# Patient Record
Sex: Male | Born: 1940 | Race: White | Hispanic: No
Health system: Southern US, Community
[De-identification: ages and names within clinical notes are randomized; demographics above are authoritative.]

---

## 2005-09-05 ENCOUNTER — Ambulatory Visit: Payer: Self-pay | Admitting: Family Medicine

## 2007-06-30 ENCOUNTER — Ambulatory Visit: Payer: Self-pay | Admitting: Gastroenterology

## 2007-07-31 ENCOUNTER — Inpatient Hospital Stay: Payer: Self-pay | Admitting: Orthopaedic Surgery

## 2007-08-01 ENCOUNTER — Other Ambulatory Visit: Payer: Self-pay

## 2011-03-07 ENCOUNTER — Ambulatory Visit: Payer: Self-pay | Admitting: Neurology

## 2012-01-15 ENCOUNTER — Ambulatory Visit: Payer: Self-pay

## 2012-03-16 ENCOUNTER — Emergency Department: Payer: Self-pay | Admitting: Emergency Medicine

## 2012-03-16 LAB — CSF CELL COUNT WITH DIFFERENTIAL
CSF Tube #: 3
Eosinophil: 0 %
Lymphocytes: 17 %
Lymphocytes: 55 %
Monocytes/Macrophages: 0 %
Monocytes/Macrophages: 18 %
Neutrophils: 27 %
Neutrophils: 83 %
Other Cells: 0 %
Other Cells: 0 %
RBC (CSF): 7422 /mm3
WBC (CSF): 35 /mm3
WBC (CSF): 8 /mm3

## 2012-03-16 LAB — COMPREHENSIVE METABOLIC PANEL
Albumin: 4.1 g/dL (ref 3.4–5.0)
Anion Gap: 7 (ref 7–16)
BUN: 18 mg/dL (ref 7–18)
Co2: 29 mmol/L (ref 21–32)
Creatinine: 1.16 mg/dL (ref 0.60–1.30)
EGFR (Non-African Amer.): >60
Glucose: 79 mg/dL (ref 65–99)
SGOT(AST): 26 U/L (ref 15–37)
SGPT (ALT): 18 U/L
Sodium: 138 mmol/L (ref 136–145)
Total Protein: 7.3 g/dL (ref 6.4–8.2)

## 2012-03-16 LAB — CBC
HGB: 14.9 g/dL (ref 13.0–18.0)
MCH: 28.4 pg (ref 26.0–34.0)
MCV: 88 fL (ref 80–100)
Platelet: 179 10*3/uL (ref 150–440)
RBC: 5.23 10*6/uL (ref 4.40–5.90)

## 2012-03-16 LAB — OSMOLALITY, URINE: Osmolality: 411 mOsm/kg

## 2012-03-16 LAB — PROTIME-INR
INR: 0.9
Prothrombin Time: 12.8 secs (ref 11.5–14.7)

## 2012-03-16 LAB — CK TOTAL AND CKMB (NOT AT ARMC): CK, Total: 324 U/L — ABNORMAL HIGH (ref 35–232)

## 2014-05-01 ENCOUNTER — Emergency Department: Payer: Self-pay | Admitting: Emergency Medicine

## 2014-05-01 LAB — DRUG SCREEN, URINE

## 2014-05-01 LAB — COMPREHENSIVE METABOLIC PANEL
ALBUMIN: 3.9 g/dL (ref 3.4–5.0)
ALK PHOS: 96 U/L
ALT: 14 U/L (ref 12–78)
ANION GAP: 4 — AB (ref 7–16)
BUN: 9 mg/dL (ref 7–18)
Bilirubin,Total: 0.3 mg/dL (ref 0.2–1.0)
CALCIUM: 8.8 mg/dL (ref 8.5–10.1)
CHLORIDE: 103 mmol/L (ref 98–107)
CO2: 31 mmol/L (ref 21–32)
Creatinine: 1.1 mg/dL (ref 0.60–1.30)
EGFR (Non-African Amer.): >60
GLUCOSE: 90 mg/dL (ref 65–99)
Osmolality: 274 (ref 275–301)
Potassium: 3.6 mmol/L (ref 3.5–5.1)
SGOT(AST): 21 U/L (ref 15–37)
Sodium: 138 mmol/L (ref 136–145)
Total Protein: 6.8 g/dL (ref 6.4–8.2)

## 2014-05-01 LAB — URINALYSIS, COMPLETE
BILIRUBIN, UR: NEGATIVE
Bacteria: NONE SEEN
Blood: NEGATIVE
Glucose,UR: NEGATIVE mg/dL (ref 0–75)
KETONE: NEGATIVE
LEUKOCYTE ESTERASE: NEGATIVE
NITRITE: NEGATIVE
Ph: 5 (ref 4.5–8.0)
Protein: NEGATIVE
RBC,UR: 2 /HPF (ref 0–5)
SPECIFIC GRAVITY: 1.013 (ref 1.003–1.030)
Squamous Epithelial: NONE SEEN
WBC UR: 2 /HPF (ref 0–5)

## 2014-05-01 LAB — CBC
HCT: 42 % (ref 40.0–52.0)
HGB: 14 g/dL (ref 13.0–18.0)
MCH: 30.7 pg (ref 26.0–34.0)
MCHC: 33.4 g/dL (ref 32.0–36.0)
MCV: 92 fL (ref 80–100)
Platelet: 211 10*3/uL (ref 150–440)
RBC: 4.57 10*6/uL (ref 4.40–5.90)
RDW: 14.3 % (ref 11.5–14.5)
WBC: 7 10*3/uL (ref 3.8–10.6)

## 2014-05-01 LAB — ACETAMINOPHEN LEVEL

## 2014-05-01 LAB — ETHANOL: Ethanol: <3 mg/dL

## 2014-05-01 LAB — SALICYLATE LEVEL: Salicylates, Serum: 3 mg/dL — ABNORMAL HIGH

## 2014-05-01 LAB — TSH: Thyroid Stimulating Horm: 0.6 u[IU]/mL

## 2015-01-17 ENCOUNTER — Ambulatory Visit
Admit: 2015-01-17 | Disposition: A | Discharge status: Home / Self Care | Payer: Self-pay | Attending: Internal Medicine | Admitting: Internal Medicine

## 2015-01-25 ENCOUNTER — Other Ambulatory Visit: Payer: Self-pay | Admitting: Physician Assistant

## 2015-01-25 DIAGNOSIS — I499 Cardiac arrhythmia, unspecified: Secondary | ICD-10-CM

## 2015-01-25 DIAGNOSIS — I10 Essential (primary) hypertension: Secondary | ICD-10-CM

## 2015-01-25 DIAGNOSIS — I4891 Unspecified atrial fibrillation: Secondary | ICD-10-CM

## 2015-01-26 ENCOUNTER — Inpatient Hospital Stay: Payer: Self-pay | Admitting: Internal Medicine

## 2015-02-02 DIAGNOSIS — Z5181 Encounter for therapeutic drug level monitoring: Secondary | ICD-10-CM | POA: Diagnosis not present

## 2015-02-06 ENCOUNTER — Telehealth: Payer: Self-pay

## 2015-02-06 NOTE — Telephone Encounter (Signed)
Attempted to contact pt regarding discharge [facility] on 02/02/15. Pt was d/c'd to SNF. Left message at pt's home # to call back.

## 2015-02-08 ENCOUNTER — Encounter: Payer: Self-pay | Admitting: *Deleted

## 2015-02-08 ENCOUNTER — Encounter: Payer: Self-pay | Admitting: Cardiovascular Disease

## 2015-02-17 ENCOUNTER — Ambulatory Visit
Admit: 2015-02-17 | Disposition: A | Discharge status: Home / Self Care | Payer: Self-pay | Attending: Internal Medicine | Admitting: Internal Medicine

## 2015-03-11 NOTE — Consult Note (Signed)
PATIENT NAME:  Kenneth Hubbard, Kenneth Hubbard MR#:  [id number] DATE OF BIRTH:  [birth year]  DATE OF CONSULTATION:  05/02/2014  REFERRING PHYSICIAN:   CONSULTING PHYSICIAN:  Louanna Vanliew K. Ryu Cerreta, MD  AGE:  74 years.  SEX:  Male.  RACE:  White.  SUBJECTIVE:  Patient was seen in consultation with in [facility] Emergency [facility].  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  Divorced for many years and lives in a trailer, 2 trailers which are joined together like a pancake according to him.  Patient reports one of his granddaughters lives with him and he has a dog.  Patient was brought o IVC by his daughter stating that he wanted to drink rat poison.  When he was questioned about the same, he smiled and laughed and he said, " That was just a joke.  I did not mean it.  I'm not going to hurt myself."    PAST PSYCHIATRIC HISTORY:  No previous histor of inpatient hold on psychiatry and no history of suicide attempts.  Not being followed by any psychiatrist.  ALCOHOL AND DRUGS:  Denies drinking alcohol.  Denies street drugs or prescription drug abuse. Does admit smoking nicotine cigarettes, at the rate of a pack a day for several years.    MENTAL STATUS EXAMINATION: Patient is dressed in hospital clothes, alert, and he knew [place] [facility], but he could not give the date and he said that he does not keep up with the date as he stays home.  Affect is neutral.  Mood is stable.  He denies feeling depressed. Denies feeling hopeless, helpless, but admits to feeling worthless and useless, stating, "I am here.  I am doing nothing.  I should be working at home."  No psychosis.  Denies auditory or visual hallucinations.  Denies hearing voices, seeing things.  Denies any paranoid or suspicious ideas.  He could count money.  He stated 4 quarters but did not know nickels or dimes but he knew 100 pennies in a dollar.  He knew his date of birth.  Insight and judgment intact.  He  knew [place] and he knew [place] and he could name the current president and knew that he lives in [place].  He reports that he feels low and down at certain times but currently, he is not doing anything in the hospital and he wants to go home and he contracts for safety.  He just said it as a joke and he did not mean to hurt himself and he would never.  Denies suicidal or homicidal plans.  Contracts for safety.  IMPRESSION:  Mood disorder, not otherwise specified.    I recommend discontinue IVC and discharge patient back home to be followed on outpatient basis by his primary care physician and his staff.      ____________________________ Jannet MantisSurya K. Guss Bundehalla, MD skc:dd D: 05/01/2014 17:37:00 ET T: 05/01/2014 19:43:14 ET JOB#: [id number]  cc: Monika SalkSurya K. Guss Bundehalla, MD, <Dictator> Beau FannySURYA K Wood Novacek MD ELECTRONICALLY SIGNED 05/07/2014 14:27

## 2015-03-19 NOTE — Discharge Summary (Signed)
PATIENT NAME:  Kenneth Hubbard, [name] MR#:  [id number] DATE OF BIRTH:  1940/12/13  DATE OF ADMISSION:  01/26/2015 DATE OF DISCHARGE:    ADDENDUM:  The patient has no clinical change. The patient's vital signs are stable. He is clinically stable.   The patient got insurance authorization; will be discharged to nursing home today.  Discussed with the patient the discharge plan with nurse, social worker, and case Production designer, theatre/television/filmmanager.   TIME SPENT: About 35 minutes.    ____________________________ Shaune PollackQing Afiya Ferrebee, MD qc:tr D: 02/02/2015 16:00:00 ET T: 02/02/2015 16:05:45 ET JOB#: [id number]  cc: Shaune PollackQing Garreth Burnsworth, MD, <Dictator> Shaune PollackQING Darien Mignogna MD ELECTRONICALLY SIGNED 02/02/2015 16:50

## 2015-03-19 NOTE — Consult Note (Signed)
PATIENT NAME:  Kenneth BrockCARTER, Khalid E MR#:  [id number] DATE OF BIRTH:  [birth year]  DATE OF CONSULTATION:  01/25/2015  REQUESTING PROVIDER:  Ramonita LabAruna Gouru, MD  CONSULTING PHYSICIAN:  A. Wendall MolaMelissa Marieclaire Bettenhausen, MD  CHIEF COMPLAINT: Hyperthyroidism.   HISTORY OF PRESENT ILLNESS: This is a 74 year old male with a history of dementia and hypertension, brought in by family yesterday with altered mental status. Report was that he had a gradual decline over the prior 10 weeks. His baseline status is not known to me. No family at the bedside. The patient is not a good historian. He is not able to follow commands or answer questions appropriately. History obtained from chart review.   On presentation, the patient had a tachyarrhythmia with a heart rate in the 130s. Heart rate improved into the 90s with hydration. Today, heart rate has been 110-130 range. Initial labs significant for a low TSH level of 0.029 and elevated free T4 level of 2.90. Per chart review, no known history of hyperthyroidism. No known recent exposure to iodinated contrast dye.   Medication list does not include amiodarone or lithium or glucocorticoids.   PAST MEDICAL HISTORY: 1.  Hypertension.  2.  Dementia.   OUTPATIENT MEDICATIONS: 1.  Trazodone 50 mg at bedtime.  2.  Mirtazapine 15 mg at bedtime.  3.  Losartan 50 mg daily.   PAST SURGICAL HISTORY: 1.  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  2.  Left and right arm ORIF.  ALLERGIES: No known drug allergies.   FAMILY HISTORY: No known thyroid disease.   SOCIAL HISTORY: The patient lives with family. Per records, no tobacco or alcohol use.   REVIEW OF SYSTEMS: The patient unable to provide review of systems given his altered mental status.  PHYSICAL EXAMINATION: VITAL SIGNS: Height 73.9 inches, weight 136 pounds, BMI 17.5, pulse 106, BP 195/88. Temperature 99.2, respirations 20.  GENERAL: Thin, elderly white male in no acute distress.  HEENT: No proptosis, lid lag, or stare.  Oropharynx  is clear.  Mucous membranes appear moist.  NECK: Supple. No appreciable thyromegaly. No appreciable palpable thyroid nodules. No elicited neck tenderness to palpation.  CARDIAC: Tachycardic, regular rhythm. No audible murmur.  PULMONARY: Clear to auscultation bilaterally. No wheeze.  ABDOMEN: Diffusely soft, nontender, nondistended.  EXTREMITIES: No peripheral edema is present.  NEUROLOGIC: The patient oriented to person only, not place or time. Not able to follow commands.  Could not assess for baseline tremors. He would not outstretch his hands.  MUSCULOSKELETAL: No evidence of deformities or peripheral edema.  SKIN: No rash or dermatopathy noted.   LABORATORY DATA: Glucose 101, BUN 18, creatinine 0.97, CO2 of 28, calcium 9.1, troponin I on 3 occasions with 0.03, 0.05, 0.03, hemoglobin 12.2, hematocrit 38%, WBC 14.8, platelets 231,000.   ASSESSMENT: 1.  Hyperthyroidism.  2.  Altered mental status.  3.  History of dementia.  4.  Tachycardia.  PLAN:   1.  Causes of hyperthyroidism in this individual could include silent thyroiditis, autoimmune thyroid disease, or Graves' disease, or a toxic nodule or toxic multinodular goiter. No evidence that hyperthyroidism is due to medications or exposure to iodinated contrast dye.  2.  For further workup, we will obtain a thyrotropin receptor antibody, as this tends to be elevated in Graves' disease.  3.  A T3 has previously been ordered, will follow up on this once complete.  4.  We will also obtain a thyroid uptake/scan, as this should give good diagnosis of cause of his hyperthyroidism and help guide treatment.  5.  Will follow along with you. I will not be available to see the patient tomorrow; however, thyroid uptake/scan should be performed tomorrow and Friday and once that is resulted then we can determine treatment.  6.  Do not recommend starting a thioamide (e.g. methimazole) until the thyroid uptake scan has been completed.   I will also be  available over the weekend to assist with management of thyroid condition if needed.   Thank you for the kind request for consultation.     ____________________________ A. Wendall Mola, MD ams:LT D: 01/25/2015 17:06:00 ET T: 01/25/2015 20:30:10 ET JOB#: [id number]  cc: A. Wendall Mola, MD, <Dictator> Macy Mis MD ELECTRONICALLY SIGNED 01/31/2015 17:13

## 2015-03-19 NOTE — Consult Note (Signed)
General Aspect Primary Cardiologist: New to [facility] ____________  74 year old male with history of HTN, dementia, and history of reported falls who was brought into [facility] on 01/24/2015 by his daughter and son-in-law 2/2 increased confusion. He was found to have EKG changes c/w tachycardic arrhythmia that showed possible sinus tachycarida with frequent PACs, unable to fully exclude a-fib at this time.  ___________  PMH: 1. HTN 2. Dementia 3. Reported history of falls ____________   Present Illness 74 year old male with the above problem list who was brought into [facility] on 01/24/2015 by his daughter and son-in-law 2/2 increased confusion.  There is no known previous cardiac history. History is taken from the H and P. Patient is unable to provide history 2/2 his dementia. No family is present in the room. Reportedly, the daughter and son-in-law state that the patient has had somewhat of a decline over the last month and half to 2 months with increased frequency of abnormal behavior such as turning on the stove, putting a cup in the frying pan, and locking himself frequently in his room or bathroom. They also note some episodes of agitation, an episode of urinating in public or urinating outside the toilet in his bathroom and throwing food at times. The patient is cared for by his daughter, his son-in-law and his granddaughter.   Family states that over the past week or so, the patient has really not been eating or drinking very much and that was what led acutely to the ED visit on 3/8. Of note, the patient does have some history of some vague suicidal ideations. Last summer and he was brought here to the ED because he is saying he was going to kill himself by rat poison but he has not had any suicidal ideations recently. He was seen by psych at that time and felt not to be any harm to himself.    In the ED on 3/8, it was felt the patient was in atrial fibrillation with RVR and he was given fluid boluses and  his heart rate came down very quickly from the 130s to the 90s, to a controlled rate, without any medications other than IV fluids. He was found to have a total CK of the 1000, CK-MB of 18, troponin 0.03-->0.05-->0.03.   Patient and family have been told recently by their outpatient physician that there was some thought that the patient's thyroid dysfunction may be contributing to the overall picture here; thyroid labs in the ED showed TSH of 0.029, and a free thyroxine in of 2.9.   Upon review of his EKG it is actually difficult to say he is in a-fib with RVR because there are what appear to definite P waves present. He EKG appears to be sinus tachycardia with frequent PACS, however we cannot truly rule out a-fib at this time. On telemetry this morning he was found to be in sinus tachycardia with heart rate in the 1-teens. He remains somehwat demented/confused. His echo showed EF 60-65%, normal LV size and systolic function, normal RV size and systolic function, mild dilatation of the aortic root.   Physical Exam:  GEN well developed, thin, disheveled   HEENT hearing intact to voice   NECK supple  no JVD   RESP normal resp effort  coarse/smokers breath sounds   CARD Regular rate and rhythm  Tachycardic  Normal, S1, S2  No murmur   ABD denies tenderness  soft   LYMPH negative neck   EXTR negative  edema   SKIN normal to palpation   NEURO motor/sensory function intact   PSYCH alert, poor insight   Review of Systems:  ROS Pt not able to provide ROS  dementia   Medications/Allergies Reviewed Medications/Allergies reviewed   Family & Social History:  Family and Social History:  Family History Coronary Artery Disease  Cancer  Other  anxiety   Social History positive  tobacco, negative ETOH, negative Illicit drugs   + Tobacco Prior (greater than 1 year)  60 pack year history of tobacco   Place of Living Home  lives with daughter and son-in-law     dementia:    htn:     denies:    donated left kidney: 1988  Home Medications: Medication Instructions Status  losartan 50 mg oral tablet 1 tab(s) orally once a day Active  traZODone 50 mg oral tablet 1 tab(s) orally once a day (at bedtime) Active  mirtazapine 15 mg oral tablet 1 tab(s) orally once a day (at bedtime) Active   Lab Results:  Thyroid:  08-Mar-16 18:04   Thyroid Stimulating Hormone  0.029 (0.350-4.500 NOTE: New Reference Range  01/10/15)  Thyroxine, Free  2.90 (0.61-1.12 NOTE: New Reference Range  01/10/15)  Routine Chem:  09-Mar-16 02:20   Glucose, Serum  101 (65-99 NOTE: New Reference Range  01/10/15)  BUN 18 (6-20 NOTE: New Reference Range  01/10/15)  Creatinine (comp) 0.97 (0.61-1.24 NOTE: New Reference Range  01/10/15)  Sodium, Serum 142 (135-145 NOTE: New Reference Range  01/10/15)  Potassium, Serum 3.8 (3.5-5.1 NOTE: New Reference Range  01/10/15)  Chloride, Serum 106 (101-111 NOTE: New Reference Range  01/10/15)  CO2, Serum 28 (22-32 NOTE: New Reference Range  01/10/15)  Calcium (Total), Serum 9.1 (8.9-10.3 NOTE: New Reference Range  01/10/15)  Anion Gap 8  eGFR (African American) >60  eGFR (Non-African American) >60 (eGFR values <56mL/min/1.73 m2 may be an indication of chronic kidney disease (CKD). Calculated eGFR is useful in patients with stable renal function. The eGFR calculation will not be reliable in acutely ill patients when serum creatinine is changing rapidly. It is not useful in patients on dialysis. The eGFR calculation may not be applicable to patients at the low and high extremes of body sizes, pregnant women, and vegetarians.)  Cardiac:  08-Mar-16 18:04   Troponin I 0.03 (0.00-0.03 0.03 ng/mL or less: NEGATIVE  Repeat testing in 3-6 hrs  if clinically indicated. >0.03 ng/mL: POTENTIAL  MYOCARDIAL INJURY. Repeat  testing in 3-6 hrs if  clinically indicated. NOTE: An increase or decrease  of 30% or more on serial  testing suggests a   clinically important change NOTE: New Reference Range  01/10/15)    22:25   Troponin I  0.05 (0.00-0.03 0.03 ng/mL or less: NEGATIVE  Repeat testing in 3-6 hrs  if clinically indicated. >0.03 ng/mL: POTENTIAL  MYOCARDIAL INJURY. Repeat  testing in 3-6 hrs if  clinically indicated. NOTE: An increase or decrease  of 30% or more on serial  testing suggests a  clinically important change NOTE: New Reference Range  01/10/15)  09-Mar-16 02:20   CPK-MB, Serum  9.8 (0.5-5.0 NOTE: New Reference Range  01/10/15)  Troponin I 0.03 (0.00-0.03 0.03 ng/mL or less: NEGATIVE  Repeat testing in 3-6 hrs  if clinically indicated. >0.03 ng/mL: POTENTIAL  MYOCARDIAL INJURY. Repeat  testing in 3-6 hrs if  clinically indicated. NOTE: An increase or decrease  of 30% or more on serial  testing suggests a  clinically important change NOTE: New Reference  Range  01/10/15)  Routine Hem:  08-Mar-16 18:04   WBC (CBC)  13.0  RBC (CBC) 4.72  Hemoglobin (CBC) 13.1  Hematocrit (CBC) 41.6  Platelet Count (CBC) 244  09-Mar-16 02:20   WBC (CBC)  14.8  RBC (CBC)  4.30  Hemoglobin (CBC)  12.2  Hematocrit (CBC)  38.0  Platelet Count (CBC) 231  MCV 89  MCH 28.5  MCHC 32.2  RDW 12.3  Neutrophil % 83.0  Lymphocyte % 9.3  Monocyte % 7.2  Eosinophil % 0.3  Basophil % 0.2  Neutrophil #  12.3  Lymphocyte # 1.4  Monocyte #  1.1  Eosinophil # 0.0  Basophil # 0.0 (Result(s) reported on 25 Jan 2015 at 02:52AM.)   EKG:  EKG Interp. by me   Interpretation EKG likely sinus tachycardia with frequent PACs though unable to fully r/o a-fib with RVR, 132, nonspecific st/t changes. Follow up EKGs showing NSR   Radiology Results: XRay:    08-Mar-16 18:11, Chest Portable Single View  Chest Portable Single View   REASON FOR EXAM:    tachycardia, SOB  COMMENTS:       PROCEDURE: DXR - DXR PORTABLE CHEST SINGLE VIEW  - Jan 24 2015  6:11PM     CLINICAL DATA:  Altered mental status,  dementia    EXAM:  PORTABLE CHEST - 1 VIEW    COMPARISON:  None.    FINDINGS:  Chronic interstitial markings/hyperinflation. No focal  consolidation. No pleural effusion or pneumothorax.  The heart is normal in size.     IMPRESSION:  No evidence of acute cardiopulmonary disease.      Electronically Signed    By: Julian Hy M.D.    On: 01/24/2015 18:20         Verified By: Julian Hy, M.D.,  Cardiology:    09-Mar-16 09:02, Echo Doppler  Echo Doppler   REASON FOR EXAM:      COMMENTS:       PROCEDURE: [facility] - ECHO DOPPLER COMPLETE(TRANSTHOR)  - Jan 25 2015  9:02AM     RESULT: Echocardiogram Report    Patient Name:   Kenneth Hubbard Date of Exam: 01/25/2015  Medical Rec #:  [id number]        Custom1:  Date of Birth:  [birth year]    Height:       74.0 in  Patient Age:    61 years      Weight:       136.7 lb  Patient Gender: M             BSA:          1.85 m??    Indications: Atrial Fib  Sonographer:    Sherrie Sport RDCS  Referring Phys: Jannifer Franklin, DAVID, F    Sonographer Comments: Technically very difficult study due to very poor   echo windows, Body habitus and the only view obtainable was parasternal.    Summary:   1. Left ventricular ejection fraction, by visual estimation, is 60 to   65%.   2. Normal global left ventricular systolic function.   3. Normal right ventricular size and systolic function.   4. Mild dilatation of the aortic root.  2D AND M-MODE MEASUREMENTS (normal ranges within parentheses):  Left Ventricle:          Normal  IVSd (2D):1.03 cm (0.7-1.1)  LVPWd (2D):     1.14 cm (0.7-1.1) Aorta/LA:  Normal  LVIDd (2D):     3.93 cm (3.4-5.7) Aortic Root (2D): 3.50 cm (2.4-3.7)  LVIDs (2D):     2.25 cm           Left Atrium (2D): 2.60 cm (1.9-4.0)  LV FS (2D):     42.7%   (>25%)  LV EF (2D):     74.5 %   (>50%)                                    Right Ventricle:                                    RVd (2D):        2.35 cm  SPECTRAL  DOPPLER ANALYSIS (where applicable):  LVOT Vmax:  LVOT VTI:  LVOT Diameter: 2.10 cm  Pulmonic Valve:  PV Max Velocity: 1.16 m/s PV Max PG: 5.4 mmHg PV Mean PG:    PHYSICIAN INTERPRETATION:  Left Ventricle: The left ventricular internal cavity size was normal. LV   posterior wall thickness was normal. Global LV systolic function was   normal. Left ventricular ejection fraction, by visual estimation, is 60     to 65%.  Right Ventricle: Normal right ventricular size, wall thickness, and   systolic function. The right ventricular size is normal. Global RV   systolic function is normal.  Left Atrium: The left atrium is normal in size.  Right Atrium: The right atrium is normal in size.  Pericardium: There is no evidence of pericardial effusion.  Mitral Valve: The mitral valve is normal in structure. Trace mitral valve   regurgitation is seen.  Tricuspid Valve: The tricuspid valve is normal. Trivial tricuspid   regurgitation is visualized.  Aortic Valve: The aortic valve was not well seen. The aortic valve is   structurally normal, with no evidence of sclerosis or stenosis. No   evidence of aortic valve regurgitation is seen.  Pulmonic Valve: The pulmonic valve is not well seen. The pulmonic valve     is normal. Trace pulmonic valve regurgitation.  Aorta: There is mild dilatation of the aortic root.    [id number] Ida Rogue MD  Electronically signed by [id number] Ida Rogue MD  Signature Date/Time: 01/25/2015/9:53:39 AM    *** Final ***    IMPRESSION: .        Verified By: Minna Merritts, M.D., MD    No Known Allergies:   Vital Signs/Nurse's Notes: **Vital Signs.:   09-Mar-16 05:10  Vital Signs Type Routine  Temperature Temperature (F) 98.2  Celsius 36.7  Temperature Source oral  Pulse Pulse 107  Respirations Respirations 22  Systolic BP Systolic BP 811  Diastolic BP (mmHg) Diastolic BP (mmHg) 88  Mean BP 115  Pulse Ox % Pulse Ox % 94  Pulse Ox Activity Level  At rest   Oxygen Delivery Room Air/ 21 %    Impression 74 year old male with history of HTN, dementia, and history of reported falls who was brought into [facility] on 01/24/2015 by his daughter and son-in-law 2/2 increased confusion. He was found to have EKG changes c/w tachycardic arrhythmia that showed possible sinus tachycarida with frequent PACs, unable to fully exclude a-fib at this time.   1. Arrhythmia: -EKG appears to show sinus tachycardia with frequent PACs, though it is difficult to fully rule out a-fib  with RVR at this time (on arrival). Since reaching the floor has been in NSR the [place] time -Treatment of this would not change given his comorbidites (dementia, unsteady gait, reported history of falls), he would not be a candidate for any anticoagulation should this be a-fib with RVR -Would manage with rate control -Lopressor 25 mg q 12 hours, first dose now (titrate as needed for rate control and blood pressure), up to metoprolol succinate 50 mg daily if tolerated -Hyperthyroidism certainly may be playing a role in this underlying arrhythmia  2. Hyperthyroidism: -Lopressor as above -Consider endocrine consult/methimazole thyroid u/s -Per IM  3. Accelerated HTN: -Lopressor added as above, slow titration upwards (daily dosing may be easier) -Increase losartan to 100 mg  4. Aortic dilatation:  -3.5 cm -Follow up echo as outpatient 12 months  5. Dementia:  -Per IM -If dementia continues to decline may wish to bring up placement, from a medication standpoint as well   Electronic Signatures: Rise Mu (PA-C)  (Signed 09-Mar-16 11:01)  Authored: General Aspect/Present Illness, History and Physical Exam, Review of System, Family & Social History, Past Medical History, Home Medications, Labs, EKG , Radiology, Allergies, Vital Signs/Nurse's Notes, Impression/Plan Ida Rogue (MD)  (Signed 09-Mar-16 18:25)  Authored: General Aspect/Present Illness, History and Physical Exam, Family &  Social History, Health Issues, EKG , Vital Signs/Nurse's Notes, Impression/Plan  Co-Signer: General Aspect/Present Illness, History and Physical Exam, Review of System, Family & Social History, Past Medical History, Home Medications, Labs, EKG , Radiology, Allergies, Vital Signs/Nurse's Notes, Impression/Plan   Last Updated: 09-Mar-16 18:25 by Ida Rogue (MD)

## 2015-03-19 NOTE — Consult Note (Signed)
Chief Complaint and History:  Referring Physician Dr. Amado CoeGouru   Chief Complaint Hyperthyroidism   Allergies:  No Known Allergies:   Assessment/Plan:  Assessment/Plan 74 yo M seen in consultation for low TSH and high free T4 c/w hyperthyroidism. Patient was admitted yesterday with h/o dementia and report of AMS and generalized decline over several week. Pt is oriented only to self. Not following commands. Chart obtained from history. Pt was examined. No goiter on exam. Has persistent tachycardia. Appears thin.  A/ Hyperthyroidism - causes may include silent thyroiditis, Grave's Disease, or a toxic thyroid nodule or toxic MNG  P/ Free T3 ordered today and result is pending. Obtain thyrotropin receptor antibody (TRAb) which tends to be high in Grave's Disease Obtain a thyroid uptake/scan as this will differentiate causes of hyperthyroidism and guide treatment. This is a 2 day test.  Full consult will be dictated. I will not be available tomorrow however will be available on Friday 3/11 as well as over the weekend to review results of all studies and determine next step in eval and treatment.   Electronic Signatures: Raj JanusSolum, Anna M (MD)  (Signed 09-Mar-16 13:12)  Authored: Chief Complaint and History, ALLERGIES, Assessment/Plan   Last Updated: 09-Mar-16 13:12 by Raj JanusSolum, Anna M (MD)

## 2015-03-19 NOTE — Discharge Summary (Signed)
PATIENT NAME:  Kenneth Hubbard, Kenneth Hubbard MR#:  [id number] DATE OF BIRTH:  June 15, 1941  DATE OF ADMISSION:  01/26/2015 DATE OF DISCHARGE:  02/01/2015  DISCHARGE DIAGNOSES:  1. New onset atrial fibrillation with rapid ventricular response.  2. 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  3. Dehydration.  4. 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  5. Hypertension.   CONDITION: Stable.   CODE STATUS: DO NOT RESUSCITATE.   HOME MEDICATIONS: Please refer to the medication reconciliation list.   DIET: Low-sodium diet.   ACTIVITY: As tolerated.   FOLLOWUP CARE: Follow with PCP and  Dr. Tedd SiasSolum within 1 to 2 weeks. Also, the patient needs to follow up with palliative care.   REASON FOR ADMISSION: Altered mental status.   HOSPITAL COURSE: The patient is a 74 year old Caucasian male with a history of hypertension and dementia, who was sent to ED due to altered mental status. The patient has declining condition for 1 to 2 months with increased abnormal behavior. The patient was found to have atrial fibrillation with rapid ventricular response in the ED and was given a fluid bolus and heart rate decreased from 130s to 90s. For detailed history and physical examination, please refer to the admission note dictated by Dr. Anne HahnWillis.   1. For new onset of his atrial fibrillation with rapid ventricular response, which is possibly triggered by dehydration and hyperthyroidism, the patient has been treated with Cardizem and Toprol XL. Heart rate has been controlled. The patient got an echocardiogram, which showed normal ejection fraction of 60% to 65%. According to cardiology recommendation, the patient needs to continue Cardizem 180 mg and Toprol 50 mg p.o. The patient has a high risk for fall and bleeding. He is not a candidate for anticoagulation.  2. For altered mental status, which is possibly due to Alzheimer dementia, and hyperthyroidism. The patient's altered mental  status is better, but still demented. He follows immediate commands. He has good oral intake. According to physical therapy evaluation, the patient needs skilled nursing facility placement.  3. For hyperthyroidism secondary to multinodular goiter, Dr. Tedd SiasSolum suggests starting methimazole. The patient needs followup with  Dr. Tedd SiasSolum as outpatient.  4. Hypertension has been controlled with hypertension medication.  5. Dehydration has been treated with IV fluid support. It is better.  The patient is demented, but vital signs are stable. He is clinically stable and will be discharged to a skilled nursing facility. We are waiting for insurance authorization. Hopefully, discharged to a skilled nursing facility today. I discussed the patient's discharge plan with the nurse, case manager and social worker.   TIME SPENT: About 42 minutes.  ____________________________ Shaune PollackQing Arnisha Laffoon, MD qc:ap D: 02/01/2015 13:14:35 ET T: 02/01/2015 13:26:31 ET JOB#: [id number]  cc: Shaune PollackQing Brandilynn Taormina, MD, <Dictator> Shaune PollackQING Terrie Grajales MD ELECTRONICALLY SIGNED 02/01/2015 17:19

## 2015-03-19 NOTE — H&P (Signed)
PATIENT NAME:  Kenneth Hubbard, Kenneth Hubbard MR#:  [id number] DATE OF BIRTH:  28-Apr-1941  DATE OF ADMISSION:  01/24/2015  CHIEF COMPLAINT: Altered mental status.   HISTORY OF PRESENT ILLNESS: This is a 74 year old male who was brought today by his daughter and son-in-law to the ED for progressive worsening altered mental status. The patient has a history of hypertension and dementia. Daughter and son-in-law provide most of the history today as the patient is confused and unable to relate much history.  The daughter and son-in-law state that the patient has had somewhat of a decline over the last month and half to 2 months with increased frequency of abnormal behavior such as turning on the stove and putting a cup in the frying pain or locking himself frequently in his room or  bathroom; some episodes of agitation, an episode of urinating in public or urinating outside the toilet in his bathroom, throwing food at times. The patient is cared for by his daughter, his son-in-law and his granddaughter.   Family states that over the past week or so, the patient has really not been eating or drinking very much and that was what led acutely to the ED visit today. Of note, the patient does have some history of some vague suicidal ideations. Last summer and he was brought here to the ED because he is saying he was going to by rat poison to take but he has not had any suicidal ideations recently.   In the ED today, the patient was found to be in atrial fibrillation  with RVR. He was given fluid boluses and his heart rate came down very quickly from the 130s to the 90s, to a controlled rate, without any medications other than IV fluids. He was found to have a total CK of the 1000, CK-MB of 18, which is elevated, although his first troponin was 0.03, and normal.   Patient and family have been told recently by their outpatient physician that there was some thought that the patient's thyroid dysfunction may be contributing to the  overall picture here; thyroid labs in the ED showed TSH of 0.029, and a free thyroxine in of 2.9. Hospitalists were called for admission due to elevated CK-MB, abnormal thyroid labs and altered mental status.   PRIMARY CARE PHYSICIAN: Nonlocal, [facility].   PAST MEDICAL HISTORY: Hypertension and dementia.   MEDICATIONS: Trazodone 50 mg at bedtime, mirtazapine 15 mg at bedtime, losartan 50 mg daily.   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   ALLERGIES: No known drug allergies.   FAMILY HISTORY: CAD, cancer, anxiety.   SOCIAL HISTORY: He is an ex-smoker, he quit half a year ago, he has a 60 pack-year smoking history. He does not use alcohol. He does not use illicit drugs.   REVIEW OF SYSTEMS:  Unable to obtain this directly from the patient given his altered mental status.  CONSTITUTIONAL: Per family, he has had no recent complaints of fever, fatigue, or weakness.  EYES: No recent complaint to family of double or blurred vision, pain or redness.  EAR, NOSE, AND THROAT: No recent complaint to family of ear pain, hearing loss or difficulty swallowing.  RESPIRATORY: No recent complaint to family of cough, wheeze, or dyspnea.  CARDIOVASCULAR: No complaint to family of chest pain, edema, or palpitations.  GASTROINTESTINAL: No recent complaint to family of nausea, vomiting, diarrhea, abdominal pain or constipation.  GENITOURINARY:  No recent complaint to family of dysuria, hematuria, or frequency.  ENDOCRINE: No recently complaint to family of nocturia, heat or cold intolerance or thirst. HEMATOLOGIC AND LYMPHATIC: No recent complaint to family have easy bruising, bleeding or swollen glands.  INTEGUMENTARY: No recent complaint to family of acne, rash or lesion.  MUSCULOSKELETAL: No recent complaint to family of arthritis, joint swelling or gout.  NEUROLOGICAL: No recent complaint of  family of  numbness, weakness, or headache.  PSYCHIATRIC: The patient does have episodes of agitation per family. No recent complaints to family of insomnia or depression.   PHYSICAL EXAMINATION:  CONSTITUTIONAL: Blood pressure 155/77, pulse 94, temperature 98.6, respirations 31, with 99% of O2 saturation on room air.  GENERAL: This is a thin, elderly gentleman lying in bed, in no apparent distress, although confused and fidgety.  HEENT: Pupils equal, round, and react to light and accommodation, extraocular movements intact. No scleral icterus. Mucosal membranes are dry.  NECK: His thyroid is not enlarged, supple with no masses and nontender, no cervical adenopathy, no JVD.  RESPIRATORY: Lungs are clear to auscultation bilaterally with no rales, rhonchi or wheezes, no respiratory distress.  CARDIOVASCULAR: He has an irregular rhythm, but no murmur, rubs or gallops auscultated, good pedal pulses with no lower extremity edema.  ABDOMEN: Soft, nontender, nondistended with good bowel sounds, no hepatosplenomegaly.  MUSCULOSKELETAL: He has 5 out of 5 muscular strength with full spontaneous range of motion throughout all extremities no cyanosis or clubbing.  SKIN: No rash or lesions, skin is warm, dry, and intact.  LYMPHATIC: No adenopathy palpated.  NEUROLOGIC: Cranial nerves are intact. Sensation is intact throughout. No dysarthria or aphasia.  PSYCHIATRIC: The patient is alert, but not oriented, somewhat cooperative, very fidgety and definitely confused.   LABORATORY DATA: White blood count 13, hemoglobin 13.1, hematocrit 41.6, platelet count 244,000, sodium 141, potassium 4.2, chloride 103, bicarbonate 28, BUN 18, creatinine 0.96, glucose 110, lipase was 19, serum ethanol was 12, total protein 7, albumin 3.6, total bilirubin  1.1, alkaline phosphatase 85, AST 37, ALT 17, total CK 1010, CK-MB 18.1, troponin 0.03, TSH 0.029, free thyroxine 2.90, INR is 1.1; UA was negative, blood gas pH on ABG was 7.450, pCO2 of  34, pO2 of 98.   Chest x-ray showed no evidence of acute cardiopulmonary disease.   ASSESSMENT AND PLAN: 1.  New onset atrial fibrillation with rapid ventricular response. His rate was controlled in the ED with fluids, the atrial fibrillation was likely triggered potentially by his hyperthyroidism but also by his dehydration; that being, said, we will monitor him closely. We will use rate controlling medications if we need to if his heart rate picks up again, we will get an echocardiogram and a cardiology consult; atrial fibrillation with RVR. The patient did have some elevation in his CK-MB, we will continue to trend CK-MB and troponins to see if he has further progression of elevation of his cardiac enzymes, even if he does, it is likely due to demand ischemia rather than overt acute coronary syndrome.  2.  Dementia. Unclear if this patient is having acute mental status change or if he is having progression of dementia or likely both. His decline from baseline over the past several months is likely due to his dementia but also potentially due to his hyperthyroidism. For now, we will use Haldol p.r.n. for agitation, and we will work up his thyroid abnormality.  3.  Hyperthyroidism. Seen on his laboratories today, this is in setting of acute stress on  his body and so it is unclear if this is baseline abnormality, although it seems, possible at this point, we will also send to the check a T3 value, and we will consider an endocrine consult tomorrow.    4.  Hypertension. This seems to be normally well controlled in the outpatient setting with  losartan; we will continue this medication here.  5.  Deep vein thrombosis prophylaxis. Subcutaneous Lovenox.   This patient is DO NOT RESUSCITATE.   Time spent on this admission was 50 minutes.    ____________________________ Candace Cruiseavid F. Anne HahnWillis, MD dfw:nt D: 01/24/2015 21:59:19 ET T: 01/24/2015 22:39:27 ET JOB#: [id number]  cc: Candace Cruiseavid F. Anne HahnWillis, MD,  <Dictator> Basir Niven Scotty CourtF Lavender Stanke MD ELECTRONICALLY SIGNED 01/25/2015 3:57

## 2015-03-19 NOTE — Consult Note (Signed)
Psychiatry: Follow-up for this patient with delirium.  On examination today the patient has no complaints.  He remains confused and unable to give any history. review of systems he denies having pain but otherwise can't reasonably answer any other questions. mental status this is a disheveled and sick-looking gentleman.  Eye contact minimal.  Constantly looking around the room in a confused manner.  Talks to the television.  Clearly delirious unable to answer questions in a straightforward manner.  Waxing and waning mental state. remains delirium of unknown specific etiology.  Patient may have an underlying dementia with a recent change.  Can't evaluate presence or absence of depression currently but will continue on Remeron to try and treat any depression that may be present.  Diagnosis delirium  Electronic Signatures: Wavie Hashimi, Jackquline DenmarkJohn T (MD)  (Signed on 10-Mar-16 19:28)  Authored  Last Updated: 10-Mar-16 19:28 by Audery Amellapacs, Bryonna Sundby T (MD)

## 2015-03-19 NOTE — Consult Note (Signed)
Chief Complaint:  Subjective/Chief Complaint Awake, not very interactive.  Not hurting anywhere and not short of breath.  HR 90s, atrial fibrillation.   VITAL SIGNS/ANCILLARY NOTES: **Vital Signs.:   13-Mar-16 06:57  Pulse Pulse 106  Systolic BP Systolic BP 269  Diastolic BP (mmHg) Diastolic BP (mmHg) 96  Mean BP 117   Brief Assessment:  GEN no acute distress   Cardiac -- LE edema  -- JVD  --Gallop   Respiratory normal resp effort  clear BS   Gastrointestinal details normal Soft  Nontender  Nondistended   EXTR negative cyanosis/clubbing   Lab Results: Routine Chem:  13-Mar-16 05:27   Glucose, Serum  135 (65-99 NOTE: New Reference Range  01/10/15)  BUN  34 (6-20 NOTE: New Reference Range  01/10/15)  Creatinine (comp) 1.04 (0.61-1.24 NOTE: New Reference Range  01/10/15)  Sodium, Serum 145 (135-145 NOTE: New Reference Range  01/10/15)  Potassium, Serum 4.2 (3.5-5.1 NOTE: New Reference Range  01/10/15)  Chloride, Serum  112 (101-111 NOTE: New Reference Range  01/10/15)  CO2, Serum 26 (22-32 NOTE: New Reference Range  01/10/15)  Calcium (Total), Serum 9.2 (8.9-10.3 NOTE: New Reference Range  01/10/15)  Anion Gap 7  eGFR (African American) >60  eGFR (Non-African American) >60 (eGFR values <3m/min/1.73 m2 may be an indication of chronic kidney disease (CKD). Calculated eGFR is useful in patients with stable renal function. The eGFR calculation will not be reliable in acutely ill patients when serum creatinine is changing rapidly. It is not useful in patients on dialysis. The eGFR calculation may not be applicable to patients at the low and high extremes of body sizes, pregnant women, and vegetarians.)  Cardiac:  13-Mar-16 05:27   CK, Total 155 ([id number]: New Reference Range  01/10/15)   Assessment/Plan:  Assessment/Plan:  Assessment 74yo with history of dementia, HTN, and frequent falls admitted with confusion and atrial fibrillation/RVR.  He has  been found to have hyperthyroidism from multinodular goiter.  1. Atrial fibrillation: HR reasonably controlled in the 90s.  EF 60-65% on echo.  He is on diltiazem CD and Toprol XL.  I would leave him at current doses for now, but either could be titrated up if needed.  I suspect that he will not be a good anticoagulation candidate with fall history and concern about his ability to take his meds regularly.  2. Hyperthyroidism: Toxic multinodular goiter.  Started methimazole.  Suspect this triggered atrial fibrillation.  3. Dementia: Not very interactive today.   Electronic Signatures: MLarey Dresser(MD)  (Signed 13-Mar-16 10:12)  Authored: Chief Complaint, VITAL SIGNS/ANCILLARY NOTES, Brief Assessment, Lab Results, Assessment/Plan   Last Updated: 13-Mar-16 10:12 by MLarey Dresser(MD)

## 2015-03-19 NOTE — Consult Note (Signed)
PATIENT NAME:  Kenneth Hubbard, Kenneth Hubbard MR#:  [id number] DATE OF BIRTH:  05-10-1941  DATE OF CONSULTATION:  01/25/2015  REFERRING PHYSICIAN:   CONSULTING PHYSICIAN:  Kenneth AmelJohn T. Clapacs, MD  IDENTIFYING INFORMATION AND REASON FOR CONSULT: This is a 74 year old man admitted to the hospital with altered mental status. Consult to assist with diagnosis and treatment.   HISTORY OF PRESENT ILLNESS: Information obtained from the chart, to some extent from the patient, and second hand information provided to social workers. Kenneth Hubbard was brought into the hospital with family reporting that there had been several days at least of a decline in function. It was reported that he was having more confused behaviors. Dangerous behaviors with the stove. The family is also reporting that he may have been attempting to consume some household cleaning products. The family feels like he had been seeming depressed evidently. Had not been eating well, not been sleeping well. I do not have specific details about this. It is unclear whether there was any direct evidence of suicidal intent. When the patient presented to the hospital on the 8th, he was clearly very confused and also was in atrial fibrillation. As far as I can tell, there had not been any specific recent changes to medication. I am still awaiting a call back from the daughter to get more history, as the patient is not capable of giving any information right now.   PAST PSYCHIATRIC HISTORY: This gentleman was evaluated in June 2015 in our Emergency Room after having made a statement about eating rat poison. According to the psychiatric evaluation at that time, he seemed to be fairly intact mentally and able to give an appropriate history. Dr. Guss Bundehalla seem to have found some evidence of some depression, but did not feel the patient required hospital treatment. On admission to the hospital, he is on mirtazapine 15 mg at night, I am not sure who has been prescribing that. No known  prior psychiatric history at this point.   SOCIAL HISTORY: The patient evidently lives with his daughter. Daughter and granddaughter are both listed as next of kin on the chart.   PAST MEDICAL HISTORY: He is acutely in atrial fibrillation on presentation to the hospital. He has a history of high blood pressure. Dementia had been identified as an issue at some point in the past. History of a traumatic injury to the right arm.   FAMILY HISTORY: Unknown.   REVIEW OF SYSTEMS: The patient denies being in any pain. He was not able to give me any specific complaints right now, but denied any of the symptoms I ask about, including fever, chills, hunger. Denied having hallucinations.   MENTAL STATUS EXAMINATION: Acutely and chronically ill-looking gentleman who looks older than his stated age, interviewed in a hospital bed. He had gotten himself maneuvered halfway out of the bed and was stuck when I came< <MISSING TEXT>> into the room and seemed confused about how to proceed. After (Dictation Anomaly)  him in the bed with the assistance of his nurse, the patient was really not able to engage in a conversation. Made only occasional eye contact. He spoke in a quiet mumble and did not seem to be necessarily responding to my questions. The things that I could understand did not really pertain to any of the questions that I had asked him. Even when I asked him to tell me his name, he could only get the first name out and then started spelling a word that was not his name  before trailing off. Did not attempt to do any other cognitive testing with him. At least at this point, he is not agitated.   LABORATORY RESULTS: Current chemistry profile all unremarkable. Most recent EKG shows that he has a sinus tachycardia out of the atrial fibrillation. Elevated white count, low hemoglobin and hematocrit. Low thyroid stimulating hormone. Slightly elevated free thyroxine. Ketones 1+ in the urine but no sign of infection.    VITAL SIGNS: Blood pressure is currently 195/88, pulse 106, temperature 99.2, respirations 20.   ASSESSMENT: A 74 year old gentleman who to my current exam is delirious. Difficult to make any further evaluation as to the presence of depression or any other mental health condition in the setting of acute delirium. He has abnormal vital signs, new atrial fibrillation and apparently a fairly rapid decline in his functioning at home. Family is concerned about depression, which cannot be ruled out, but really cannot be evaluated at this point. The patient has been given some antipsychotics to help control delirium with some improvement so far. Obviously, there are multiple potential causes of delirium including infections, dehydration, electrolyte abnormalities, injuries, pain, etc. I will continue to follow the patient up in the hospital and order some medication as a p.r.n. if he is agitated. Meanwhile, I have increased his mirtazapine from 15 mg to 30 mg at night to get it closer to an effective antidepressant dose in case that may be an issue. I have left a message for the patient's daughter and will followup with that.   DIAGNOSIS, PRINCIPAL AND PRIMARY:  AXIS I: Delirium.   SECONDARY DIAGNOSES:  AXIS I: Rule out depression.  AXIS II: No diagnosis.  AXIS III: Multiple medical problems.    ____________________________ Kenneth Amel, MD jtc:TM D: 01/25/2015 16:53:36 ET T: 01/25/2015 17:05:35 ET JOB#: [id number]  cc: Kenneth Amel, MD, <Dictator> Kenneth Amel MD ELECTRONICALLY SIGNED 01/25/2015 23:46

## 2015-03-19 NOTE — Consult Note (Signed)
Chief Complaint:  Subjective/Chief Complaint Wakes up, not very interactive.  Not hurting anywhere and not short of breath.  HR in 80s, atrial fibrillation.   VITAL SIGNS/ANCILLARY NOTES: **Vital Signs.:   12-Mar-16 08:35  Vital Signs Type Pre Medication  Temperature Temperature (F) 98.3  Celsius 36.8  Temperature Source oral  Pulse Pulse 94  Respirations Respirations 18  Systolic BP Systolic BP 937  Diastolic BP (mmHg) Diastolic BP (mmHg) 92  Mean BP 110  Pulse Ox % Pulse Ox % 93  Pulse Ox Activity Level  At rest  Oxygen Delivery Room Air/ 21 %   Brief Assessment:  GEN no acute distress   Cardiac Irregular  murmur present  -- LE edema  -- JVD  --Gallop   Respiratory normal resp effort  clear BS   Gastrointestinal details normal Soft  Nontender  Bowel sounds normal  No organomegaly   EXTR negative cyanosis/clubbing   Lab Results: Routine Chem:  12-Mar-16 08:45   Glucose, Serum  144 (65-99 NOTE: New Reference Range  01/10/15)  BUN  34 (6-20 NOTE: New Reference Range  01/10/15)  Creatinine (comp) 1.04 (0.61-1.24 NOTE: New Reference Range  01/10/15)  Sodium, Serum  147 (135-145 NOTE: New Reference Range  01/10/15)  Potassium, Serum 4.2 (3.5-5.1 NOTE: New Reference Range  01/10/15)  Chloride, Serum 111 (101-111 NOTE: New Reference Range  01/10/15)  CO2, Serum 26 (22-32 NOTE: New Reference Range  01/10/15)  Calcium (Total), Serum 9.5 (8.9-10.3 NOTE: New Reference Range  01/10/15)  Anion Gap 10  eGFR (African American) >60  eGFR (Non-African American) >60 (eGFR values <3mL/min/1.73 m2 may be an indication of chronic kidney disease (CKD). Calculated eGFR is useful in patients with stable renal function. The eGFR calculation will not be reliable in acutely ill patients when serum creatinine is changing rapidly. It is not useful in patients on dialysis. The eGFR calculation may not be applicable to patients at the low and high extremes of body sizes,  pregnant women, and vegetarians.)  Magnesium, Serum 2.2 (1.7-2.4 THERAPEUTIC RANGE: 4-7 mg/dL TOXIC: > 10 mg/dL  ----------------------- NOTE: New Reference Range  01/10/15)  Routine Hem:  12-Mar-16 08:45   WBC (CBC) 9.0  RBC (CBC) 4.70  Hemoglobin (CBC)  12.9  Hematocrit (CBC) 41.3  Platelet Count (CBC) 356  MCV 88  MCH 27.5  MCHC  31.4  RDW 12.9  Neutrophil % 79.3  Lymphocyte % 10.3  Monocyte % 8.0  Eosinophil % 1.2  Basophil % 1.2  Neutrophil #  7.1  Lymphocyte #  0.9  Monocyte # 0.7  Eosinophil # 0.1  Basophil # 0.1 (Result(s) reported on 28 Jan 2015 at 09:11AM.)   Assessment/Plan:  Assessment/Plan:  Assessment 74 yo with history of dementia, HTN, and frequent falls admitted with confusion and atrial fibrillation/RVR.  He has been found to have hyperthyroidism from multinodular goiter.  1. Atrial fibrillation: Rate now under better control, in 43s.  EF 60-65% on echo.  He is on diltiazem CD, metoprolol, and digoxin.  I would like to simplify this regimen a bit.  Will stop digoxin and will use Toprol XL 50 mg bid instead of metoprolol tartrate 50 mg bid for more stable rate control.  I suspect that he will not be a good anticoagulation candidate with fall history and concern about his ability to take his meds regularly.  2. Hyperthyroidism: Toxic multinodular goiter.  Started methimazole.  Suspect this triggered atrial fibrillation.  3. Dementia: Not very interactive today.   Electronic Signatures:  Larey Dresser (MD)  (Signed 12-Mar-16 10:17)  Authored: Chief Complaint, VITAL SIGNS/ANCILLARY NOTES, Brief Assessment, Lab Results, Assessment/Plan   Last Updated: 12-Mar-16 10:17 by Larey Dresser (MD)

## 2015-06-17 IMAGING — CR DG CHEST 1V PORT
1 series · 2 of 2 positions shown · non-contrast
Comparison: None.

CLINICAL DATA: Altered mental status, dementia

EXAM:
PORTABLE CHEST - 1 VIEW

[Series 1: ap · 0.17mm/px · 2 of 2 slices shown]
[im 1/2]
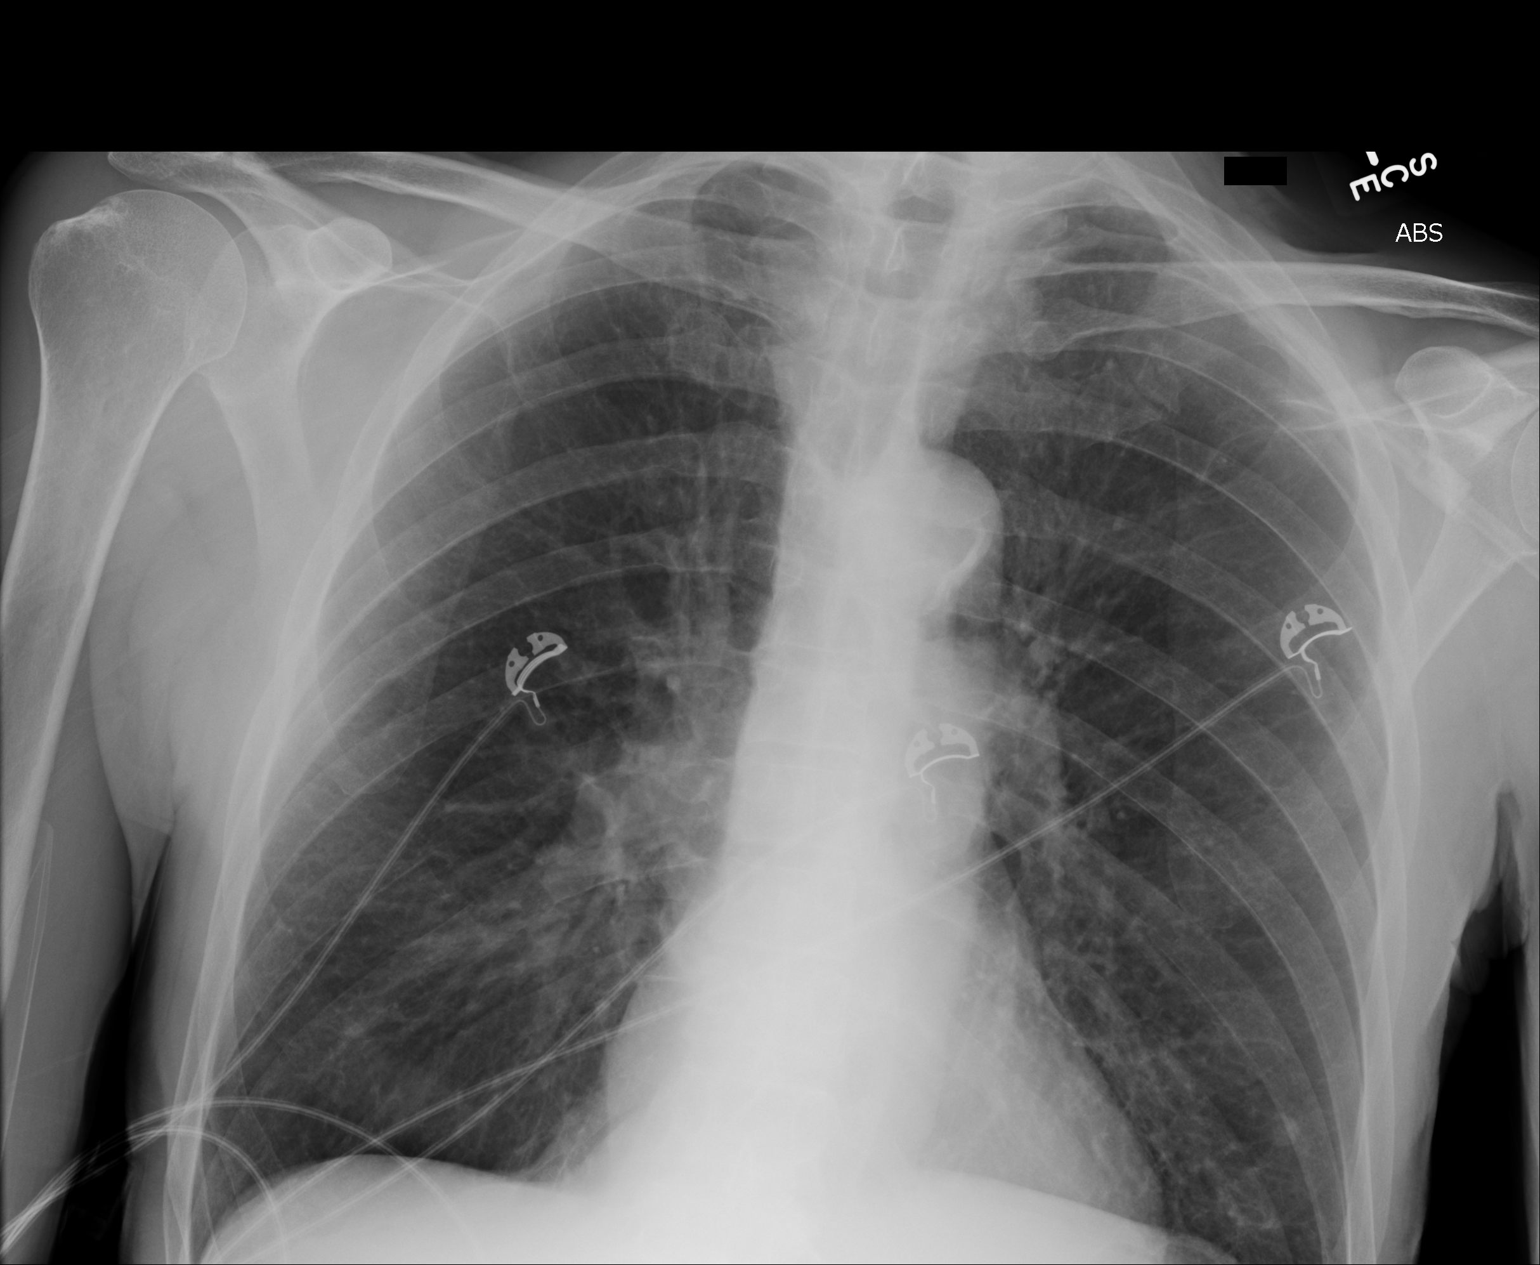
[im 2/2]
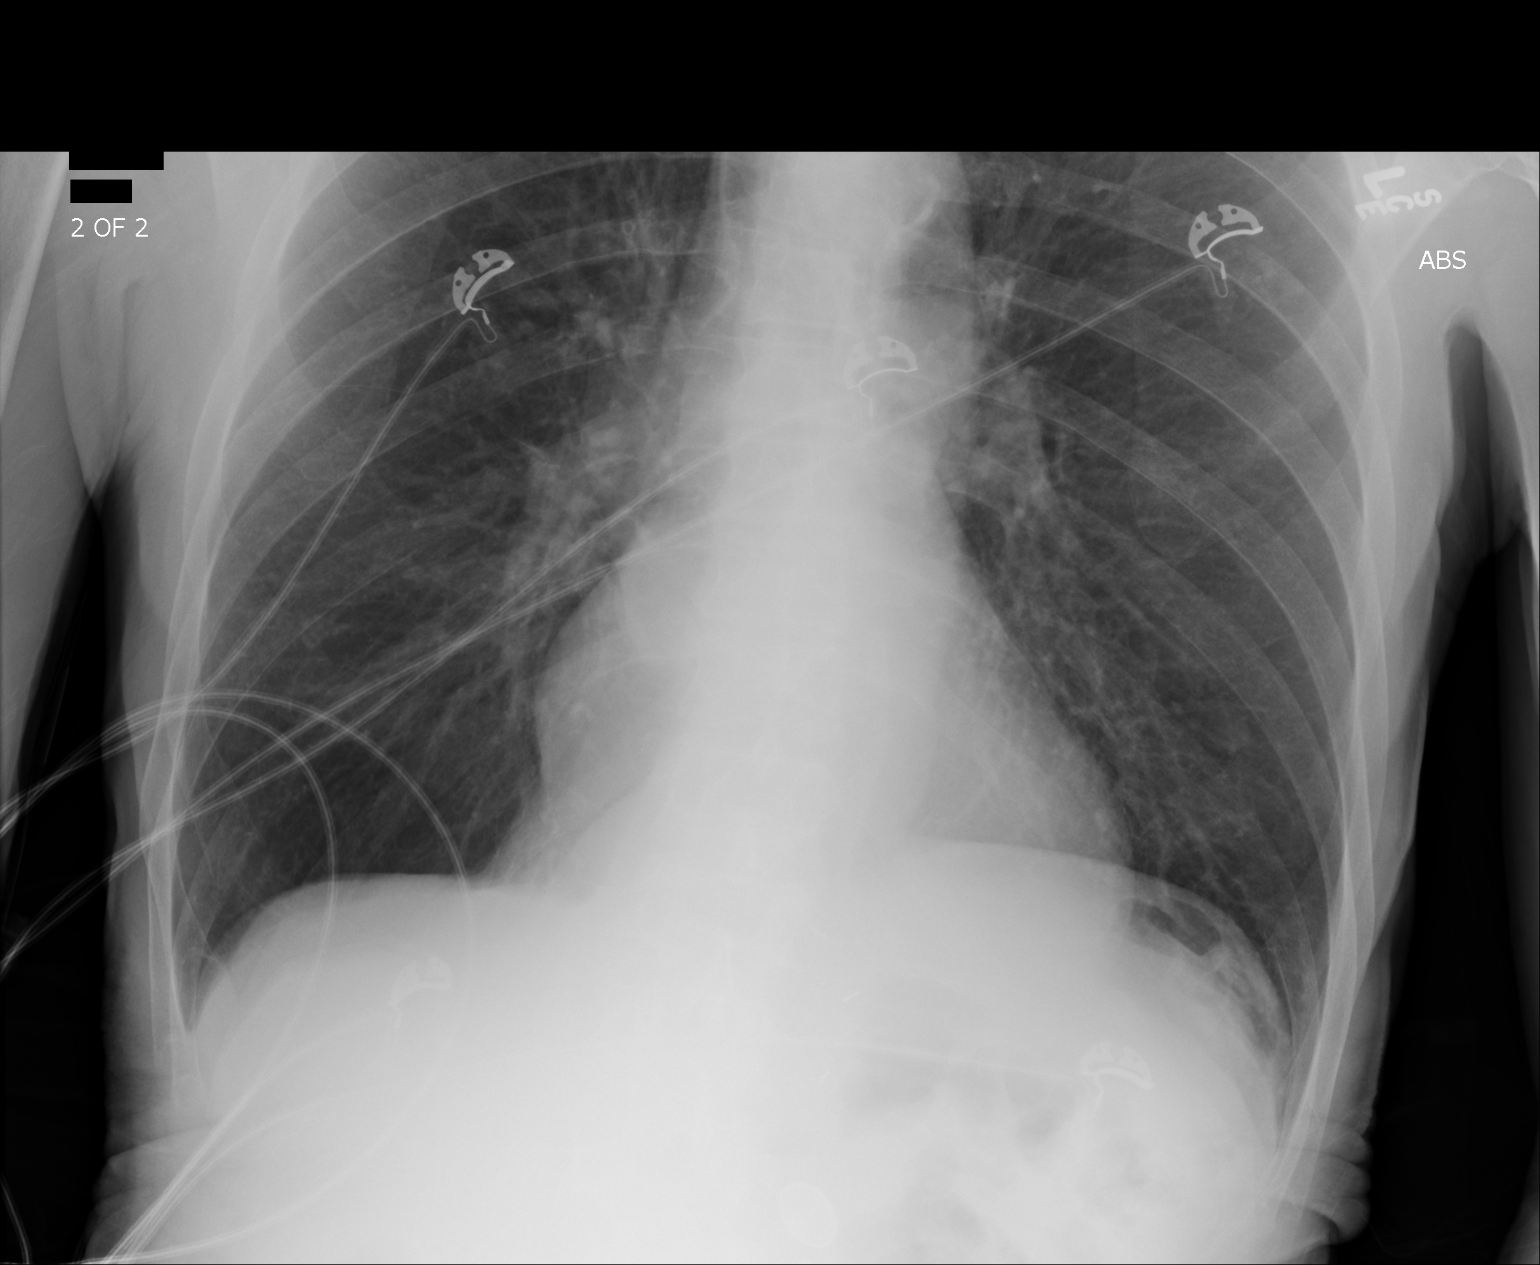

[2 of 2 positions shown; findings below may reference images not displayed]

FINDINGS: Chronic interstitial markings/hyperinflation. No focal
consolidation. No pleural effusion or pneumothorax.

The heart is normal in size.
IMPRESSION: No evidence of acute cardiopulmonary disease.

## 2015-06-18 IMAGING — CR DG CHEST 2V
1 series · 3 of 3 positions shown · non-contrast
Comparison: 01/24/2015

CLINICAL DATA: Atrial fibrillation

EXAM:
CHEST  2 VIEW

[Series 1: dxr chest pa (or ap) and lateral · 0.14mm/px · 3 of 3 slices shown]
[im 1/3]
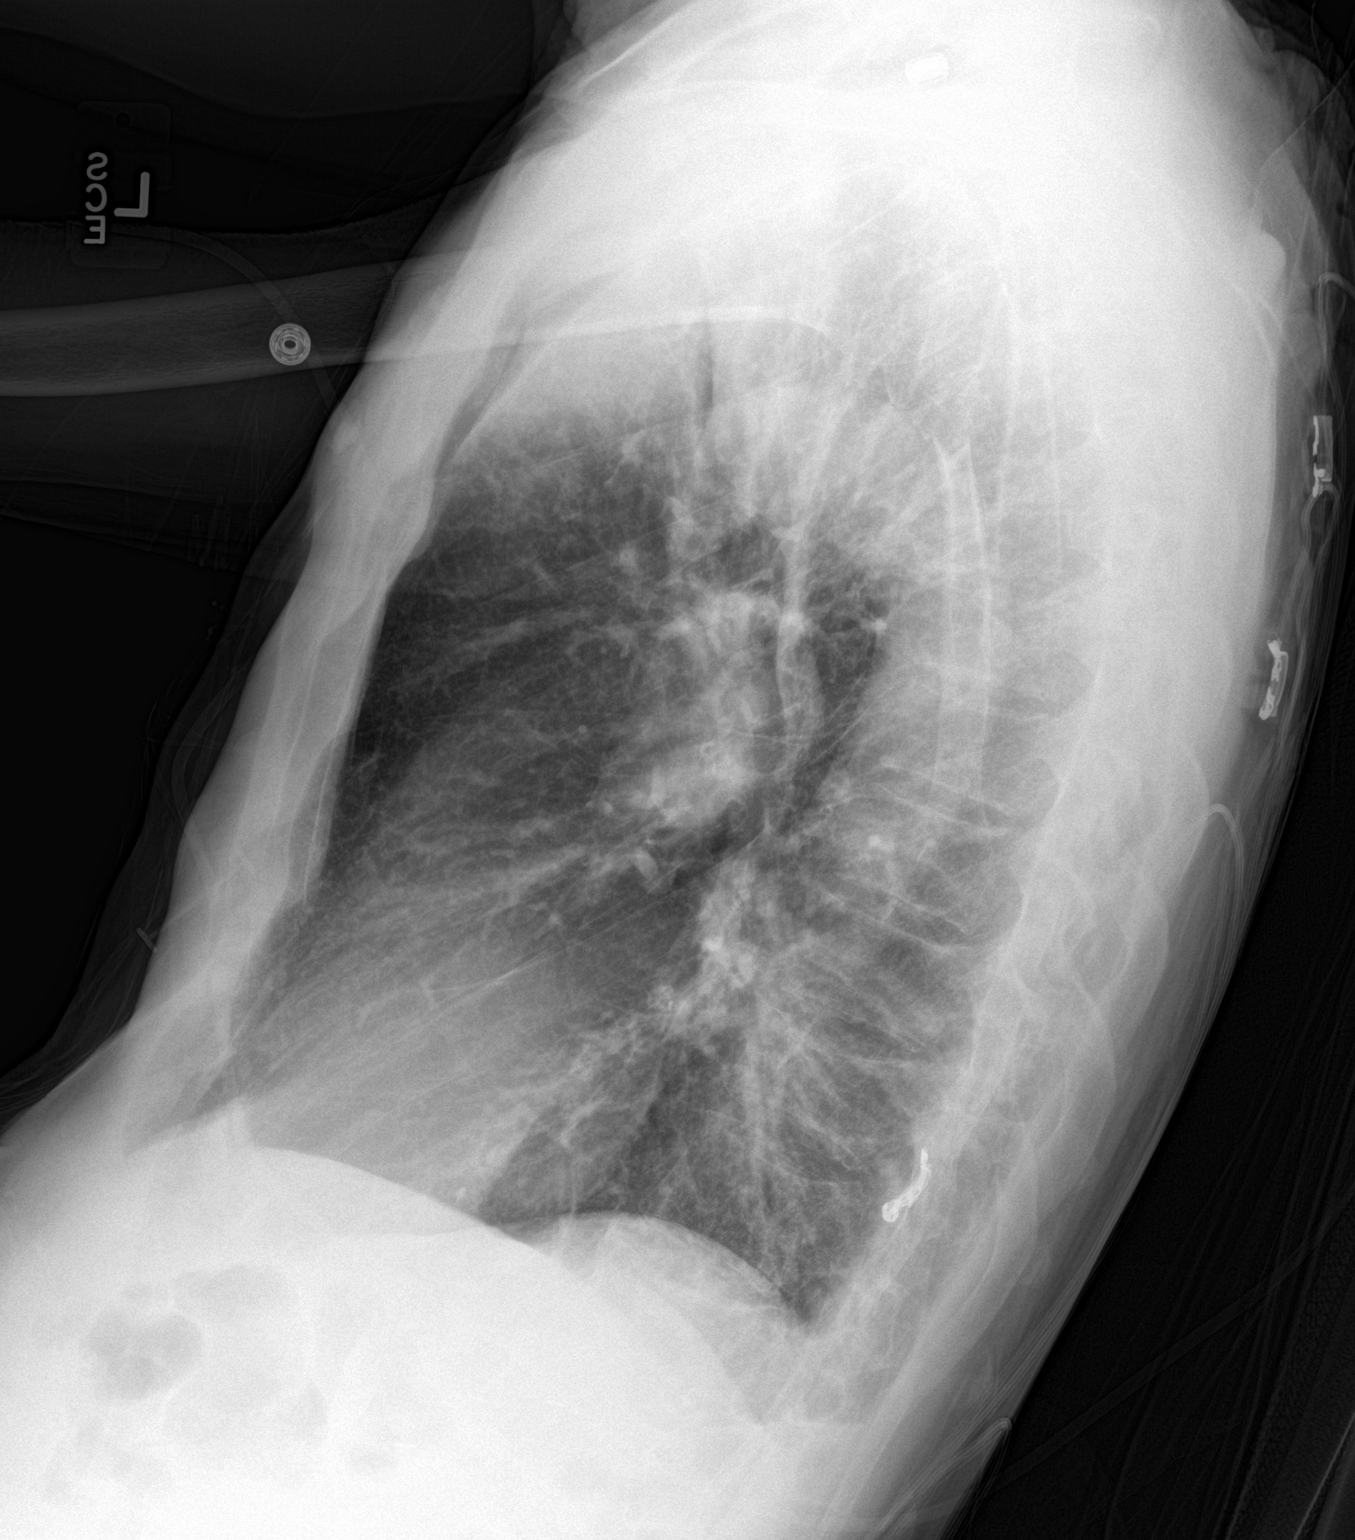
[im 2/3]
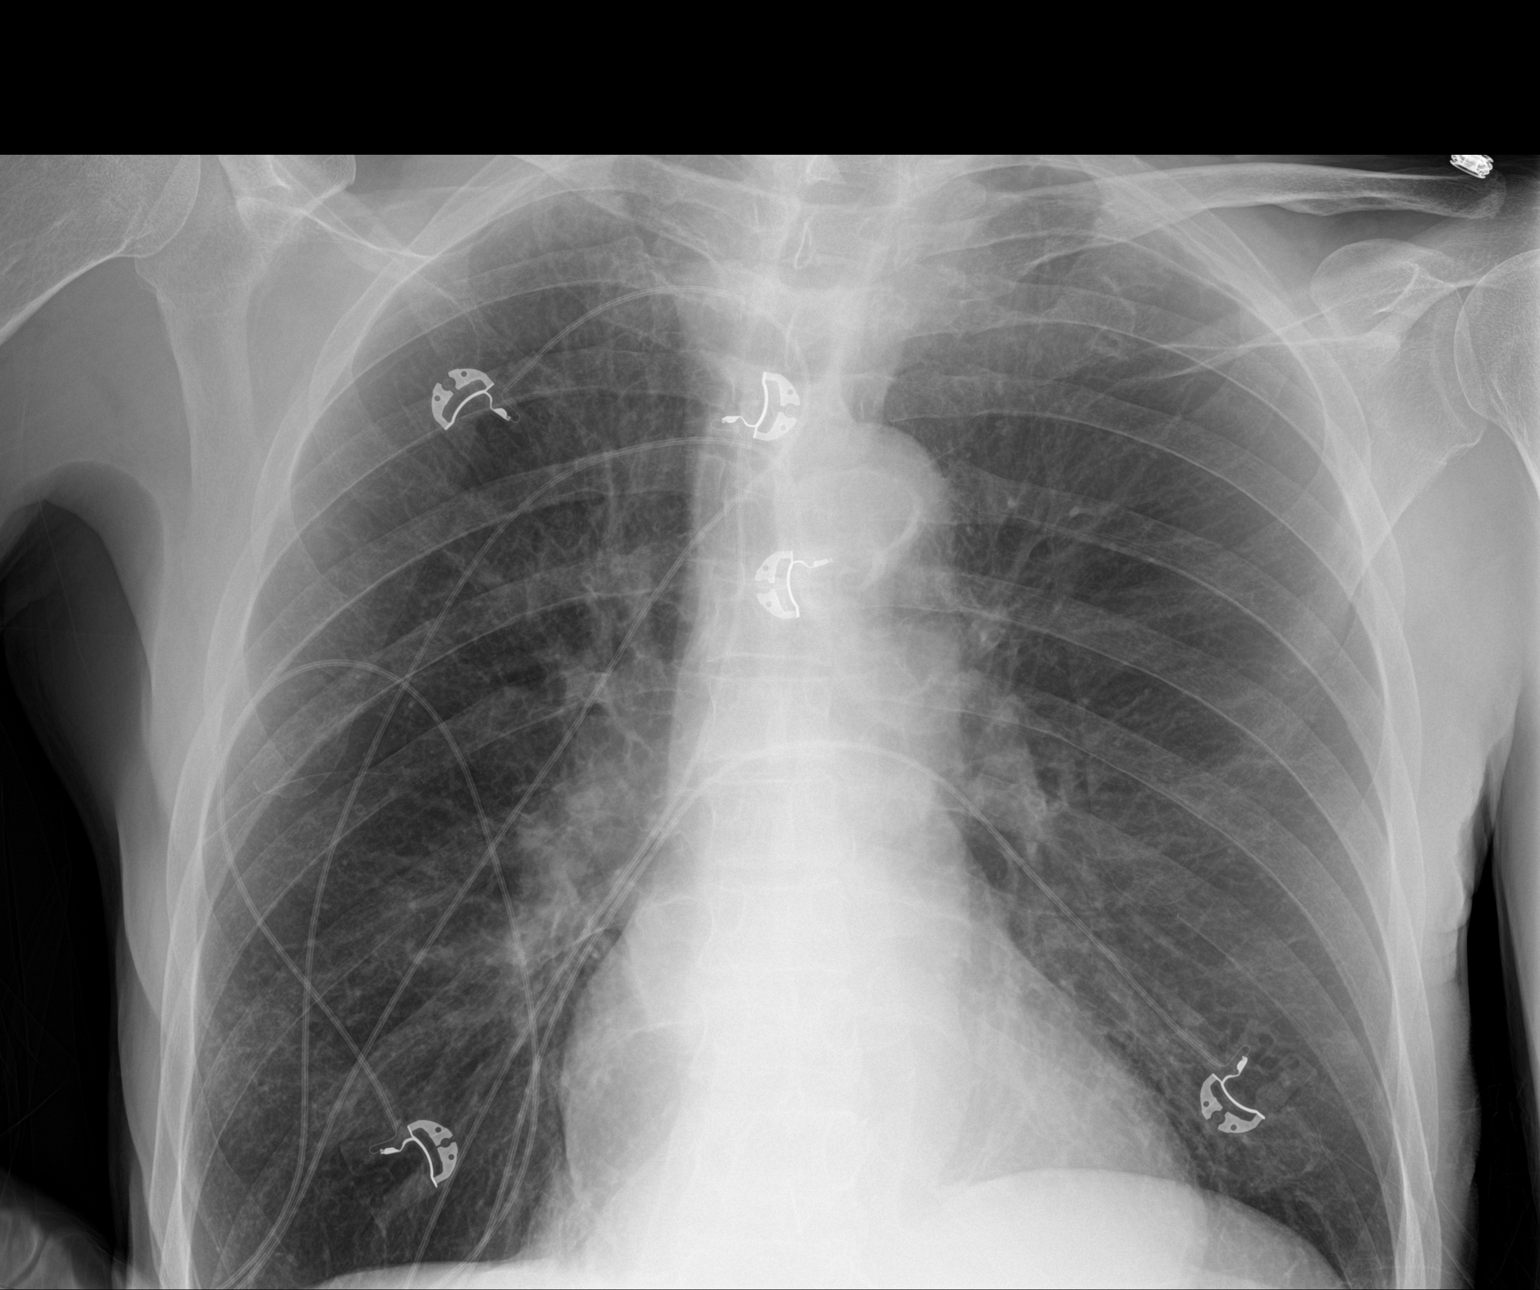
[im 3/3]
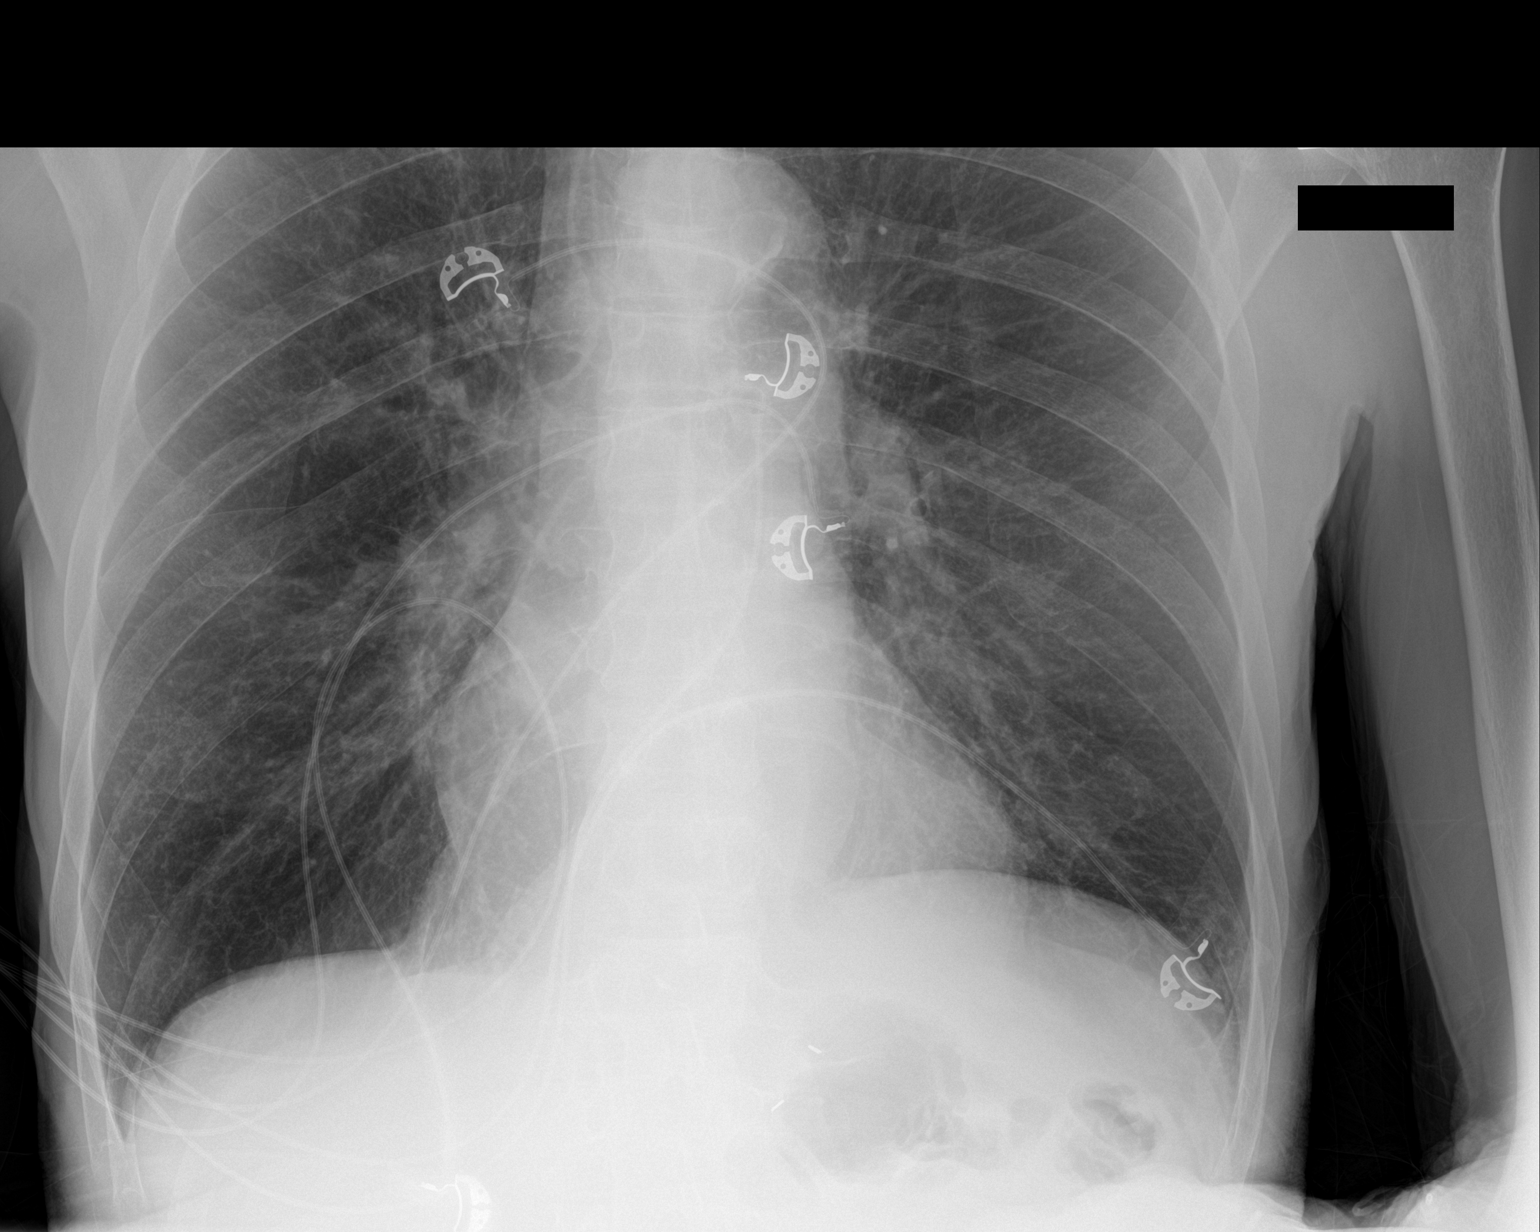

[3 of 3 positions shown; findings below may reference images not displayed]

FINDINGS: Lungs remain hyperaerated. Upper normal heart size. Normal
vascularity. No consolidation or mass. No pleural effusion or
pneumothorax.
IMPRESSION: No active cardiopulmonary disease.

## 2015-06-23 IMAGING — CT CT HEAD WITHOUT CONTRAST
1 of 2 series · 16 of 30 positions shown, 20 images · non-contrast
Comparison: 03/16/2012

CLINICAL DATA: Initial evaluation for altered mental status
possibly due to Alzheimer's dementia, current history of atrial
fibrillation

EXAM:
CT HEAD WITHOUT CONTRAST
TECHNIQUE: Contiguous axial images were obtained from the base of the skull
through the vertex without intravenous contrast.

[Series 2: head wo · axial · 0.45mm/px · z∈[-192,-48]mm · 16 of 36 slices shown, 20 images]
[im 2/36  brain]
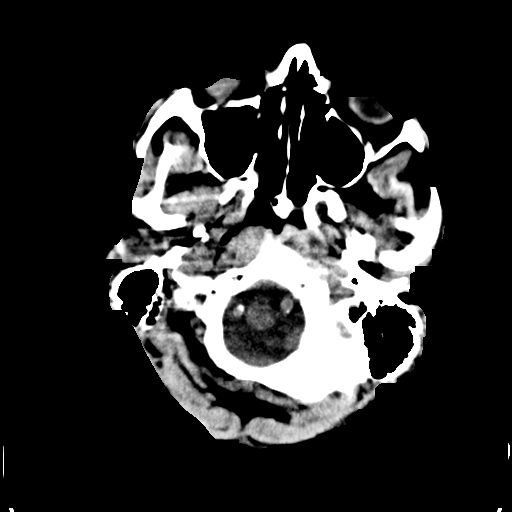
[im 2/36  bone]
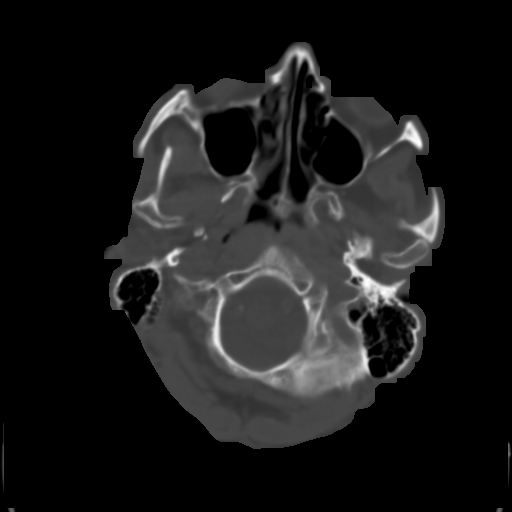
[im 4/36  brain]
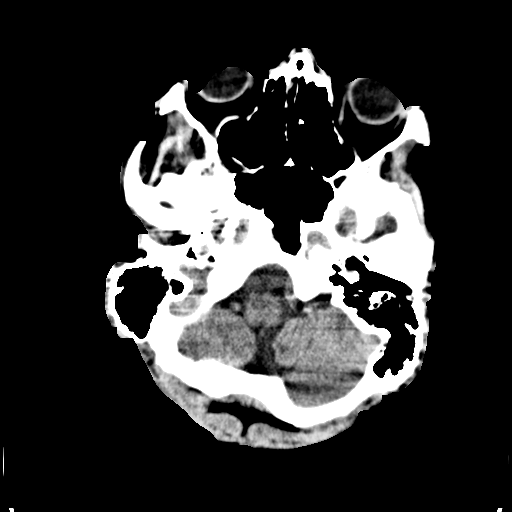
[im 6/36  brain]
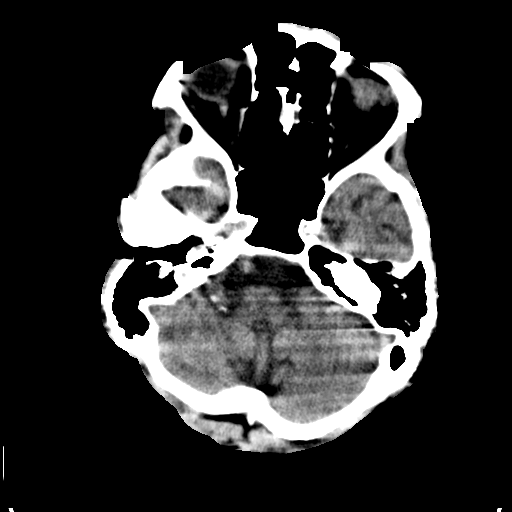
[im 9/36  brain]
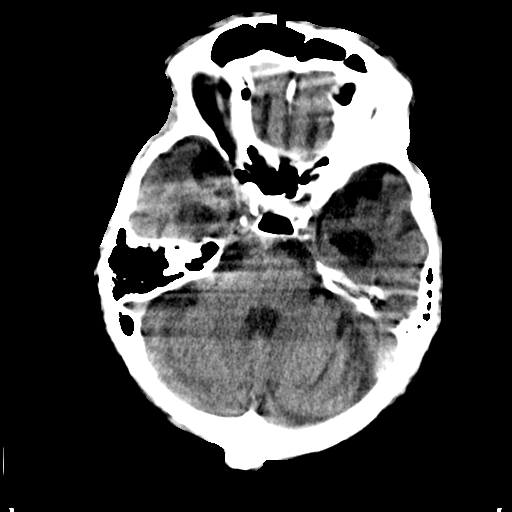
[im 11/36  brain]
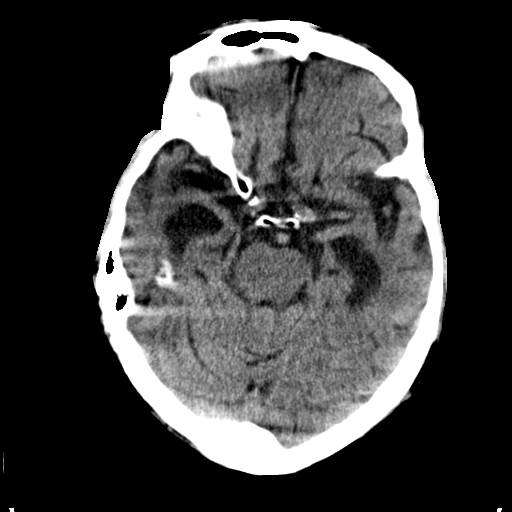
[im 11/36  bone]
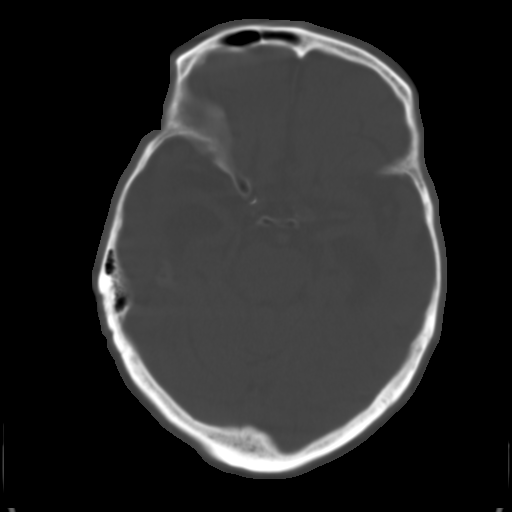
[im 13/36  brain]
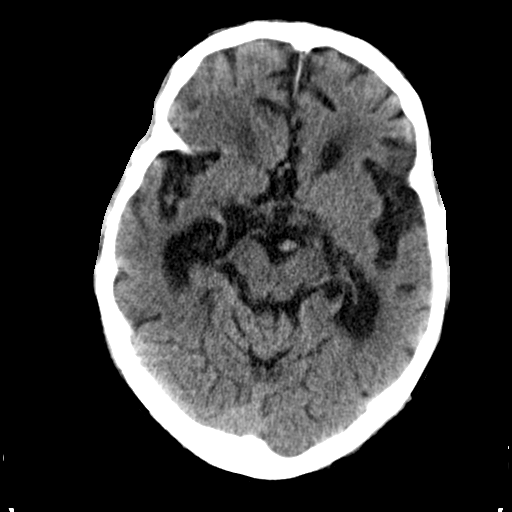
[im 15/36  brain]
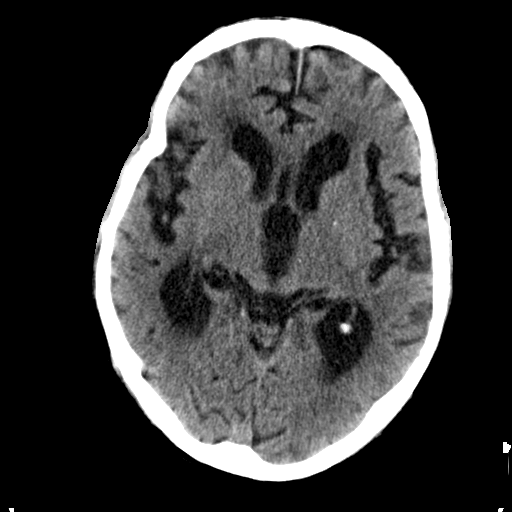
[im 16/36  brain]
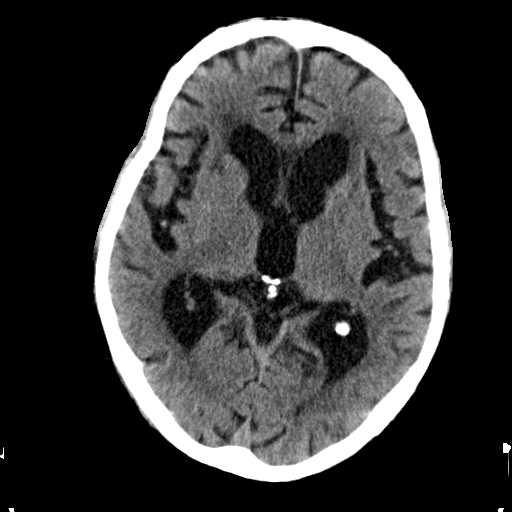
[im 20/36  brain]
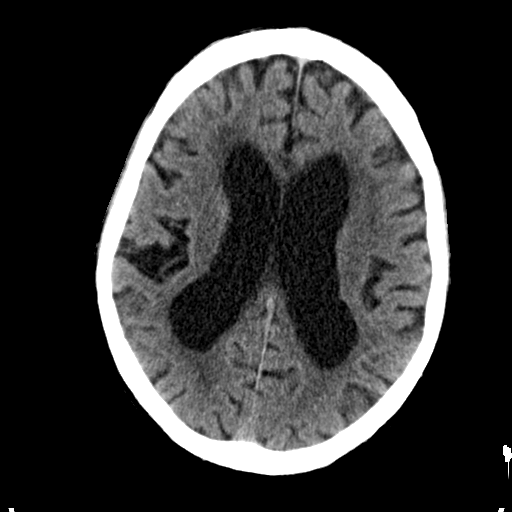
[im 20/36  bone]
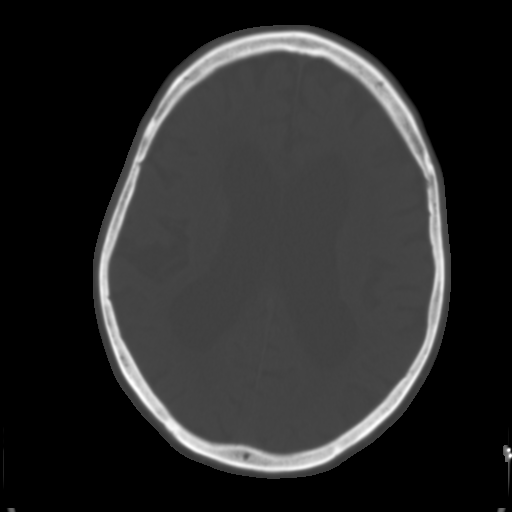
[im 22/36  brain]
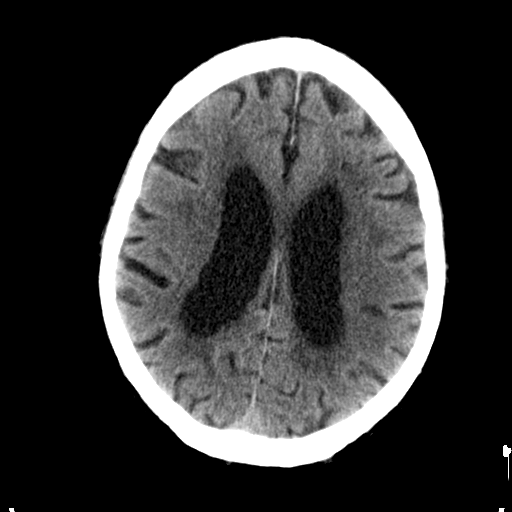
[im 23/36  brain]
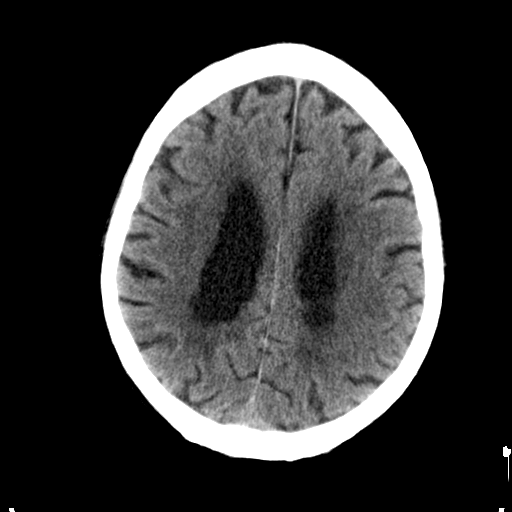
[im 25/36  brain]
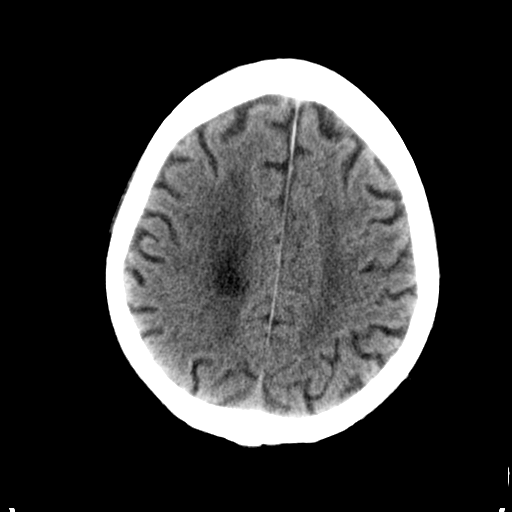
[im 27/36  brain]
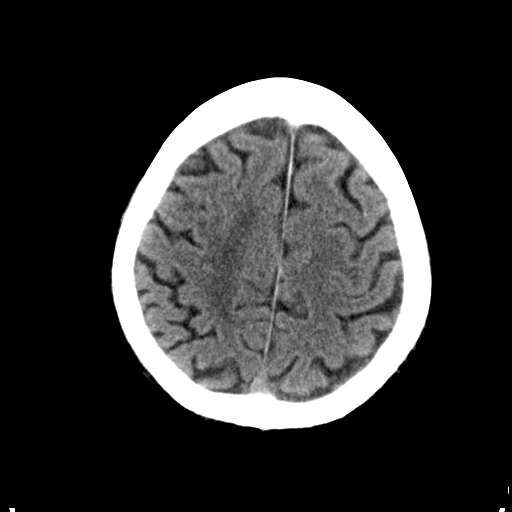
[im 27/36  bone]
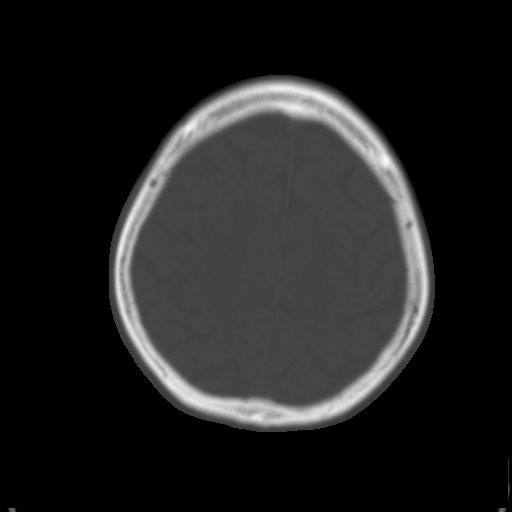
[im 30/36  brain]
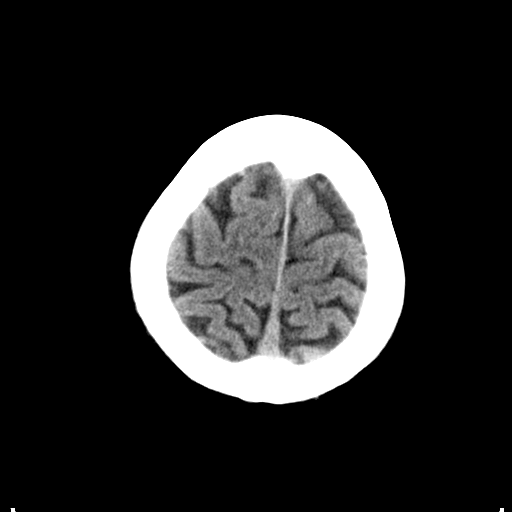
[im 32/36  brain]
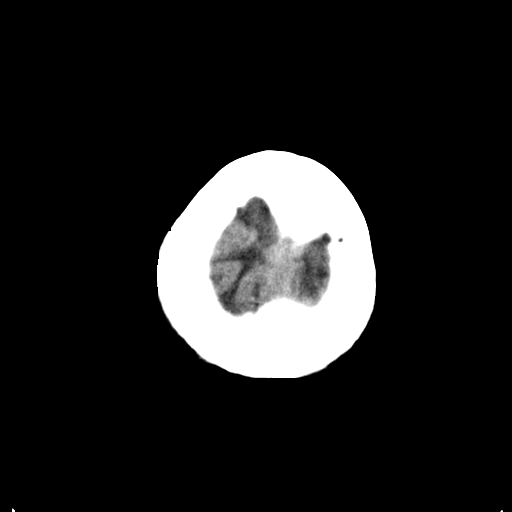
[im 34/36  brain]
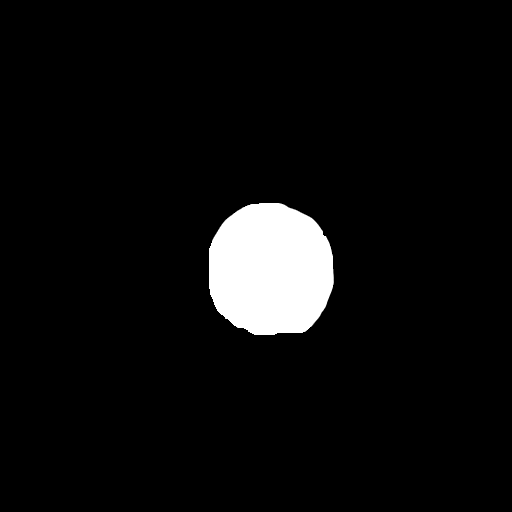

[16 of 30 positions shown; findings below may reference images not displayed]

FINDINGS: Severe atrophy. Moderate low attenuation in the deep white matter.
Dilatation of the ventricles similar to prior study, although the
temple horns are mildly more prominent. No intracranial hemorrhage
or extra-axial fluid. No evidence of vascular territory infarct or
mass. Although knee/ study, there is a chronic appearing tiny
lacunar infarct in the right thalamus.
IMPRESSION: Mildly increased ventricular dilatation possibly due to progressive
atrophy although again normal pressure hydrocephalus not excluded.
Severe chronic involutional change noted.

## 2016-01-17 DEATH — deceased
